# Patient Record
Sex: Female | Born: 1990 | Race: White | Hispanic: No | Marital: Married | Smoking: Never smoker
Health system: Southern US, Community
[De-identification: ages and names within clinical notes are randomized; demographics above are authoritative.]

## PROBLEM LIST (undated history)

## (undated) DIAGNOSIS — F419 Anxiety disorder, unspecified: Secondary | ICD-10-CM

## (undated) DIAGNOSIS — E669 Obesity, unspecified: Secondary | ICD-10-CM

## (undated) DIAGNOSIS — F32A Depression, unspecified: Secondary | ICD-10-CM

## (undated) DIAGNOSIS — N946 Dysmenorrhea, unspecified: Secondary | ICD-10-CM

## (undated) DIAGNOSIS — Z789 Other specified health status: Secondary | ICD-10-CM

## (undated) DIAGNOSIS — K8681 Exocrine pancreatic insufficiency: Secondary | ICD-10-CM

## (undated) DIAGNOSIS — Z9889 Other specified postprocedural states: Secondary | ICD-10-CM

## (undated) DIAGNOSIS — K219 Gastro-esophageal reflux disease without esophagitis: Secondary | ICD-10-CM

## (undated) HISTORY — DX: Other specified health status: Z78.9

## (undated) HISTORY — PX: COLONOSCOPY: SHX174

## (undated) HISTORY — DX: Depression, unspecified: F32.A

## (undated) HISTORY — PX: OTHER SURGICAL HISTORY: SHX169

## (undated) HISTORY — PX: HAND SURGERY: SHX662

## (undated) HISTORY — DX: Dysmenorrhea, unspecified: N94.6

## (undated) HISTORY — DX: Gastro-esophageal reflux disease without esophagitis: K21.9

## (undated) HISTORY — DX: Obesity, unspecified: E66.9

---
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
## 2017-04-01 DIAGNOSIS — Z30017 Encounter for initial prescription of implantable subdermal contraceptive: Secondary | ICD-10-CM | POA: Insufficient documentation

## 2018-02-06 ENCOUNTER — Encounter: Payer: Self-pay | Admitting: Podiatry

## 2018-02-06 ENCOUNTER — Other Ambulatory Visit: Payer: Self-pay

## 2018-02-06 ENCOUNTER — Ambulatory Visit (INDEPENDENT_AMBULATORY_CARE_PROVIDER_SITE_OTHER): Payer: Managed Care, Other (non HMO) | Admitting: Podiatry

## 2018-02-06 DIAGNOSIS — M79676 Pain in unspecified toe(s): Secondary | ICD-10-CM | POA: Diagnosis not present

## 2018-02-06 DIAGNOSIS — B351 Tinea unguium: Secondary | ICD-10-CM

## 2018-02-06 DIAGNOSIS — L600 Ingrowing nail: Secondary | ICD-10-CM

## 2018-02-06 MED ORDER — KETOCONAZOLE 2 % EX CREA: TOPICAL_CREAM | CUTANEOUS | 0 refills | Status: DC

## 2018-02-06 NOTE — Patient Instructions (Signed)

## 2018-02-06 NOTE — Progress Notes (Signed)
  Subjective:  Patient ID: Wanda Acosta, female    DOB: Aug 02, 1990,  MRN: [id number]  No chief complaint on file.  27 y.o. female presents with the above complaint. Reports ingrown toenail to the left great toenail. Reports chronic change to the toenail from a pedicure. Tried lamisil to resolve the fungus but it didn't work. Believes the fungus has spread to her skin.  No past medical history on file.  Current Outpatient Medications:  .  fluticasone (FLONASE) 50 MCG/ACT nasal spray, Place into the nose., Disp: , Rfl:  .  HYDROcodone-acetaminophen (NORCO) 10-325 MG tablet, Take by mouth., Disp: , Rfl:  .  hydrOXYzine (ATARAX/VISTARIL) 10 MG tablet, Take by mouth., Disp: , Rfl:  .  ISIBLOOM 0.15-30 MG-MCG tablet, TK 1 T PO QD, Disp: , Rfl: 3 .  LORazepam (ATIVAN) 1 MG tablet, TK 1 T PO THE EVENING BEFORE APPOINTMENT THEN 1 HOUR PRIOR TO APPOINTMENT, Disp: , Rfl: 0  Allergies not on file Review of Systems: Negative except as noted in the HPI. Denies N/V/F/Ch. Objective:  There were no vitals filed for this visit. General AA&O x3. Normal mood and affect.  Vascular Dorsalis pedis and posterior tibial pulses  present 2+ bilaterally  Capillary refill normal to all digits. Pedal hair growth normal.  Neurologic Epicritic sensation grossly present.  Dermatologic No open lesions. Interspaces clear of maceration. Ingrown toenail left toenail medial border. Nail dystrophic. Xerosis with scaling plantar foot left.  Orthopedic: MMT 5/5 in dorsiflexion, plantarflexion, inversion, and eversion. Normal joint ROM without pain or crepitus.   Assessment & Plan:  Patient was evaluated and treated and all questions answered.  Ingrown Nail, left -Patient elects to proceed with ingrown toenail removal today -Ingrown nail excised. See procedure note. -Educated on post-procedure care including soaking. Written instructions provided. -Patient to follow up in 2 weeks for nail check.  Procedure: Avulsion of  Toenail Location: Left 1st toenail. Anesthesia: Lidocaine 1% plain; 1.29mL and Marcaine 0.5% plain; 1.17mL, digital block. Skin Prep: Betadine. Dressing: Silvadene; telfa; dry, sterile, compression dressing. Technique: Following skin prep, the toe was exsanguinated and a tourniquet was secured at the base of the toe. The affected nail was freed and avulsed. The area was cleansed. The tourniquet was then removed and sterile dressing applied. Disposition: Patient tolerated procedure well. Patient to return in 2 weeks for follow-up.   Tinea Pedis L -Rx Ketoconazole.  No follow-ups on file.

## 2018-02-09 ENCOUNTER — Telehealth: Payer: Self-pay | Admitting: Podiatry

## 2018-02-09 NOTE — Telephone Encounter (Signed)
Left message for pt to call again for information concerning the toenail and the procedure.

## 2018-02-09 NOTE — Telephone Encounter (Signed)
I was in your office on Saturday and had my toenail removed. I just have some questions about the way its healing and some pain that I've been experiencing. If you could please give me a call back at [phone]. Thank you.

## 2018-02-10 NOTE — Telephone Encounter (Signed)
Pt states she started her post-procedure soaking, the 1st soak was not too bad, but the 2nd was a 10 out of 10 on the pain scale. I told pt the 1st soak she probably still had the benefit of the anesthetic, and the 2nd none, and it would also depend on the amount of area removed. Pt states the whole toenail was removed. I told pt she would probably end up soaking the toe for 2-4 weeks until the area got a dry hard scab, without redness, swelling or drainage, and to check about the end of the 3rd week by performing the last soak of the day and allowing to air dry without the antibiotic ointment dressing. I told pt if she should at anytime have increase redness, swelling or cloudy drainage to call for an appt. Pt states understanding.

## 2018-05-17 ENCOUNTER — Ambulatory Visit (INDEPENDENT_AMBULATORY_CARE_PROVIDER_SITE_OTHER): Payer: Managed Care, Other (non HMO) | Admitting: Family Medicine

## 2018-05-17 ENCOUNTER — Encounter: Payer: Self-pay | Admitting: Family Medicine

## 2018-05-17 VITALS — BP 114/72 | HR 104 | Temp 98.5°F | Ht 68.0 in | Wt 213.1 lb

## 2018-05-17 DIAGNOSIS — Z7689 Persons encountering health services in other specified circumstances: Secondary | ICD-10-CM

## 2018-05-17 DIAGNOSIS — Z9109 Other allergy status, other than to drugs and biological substances: Secondary | ICD-10-CM

## 2018-05-17 DIAGNOSIS — K219 Gastro-esophageal reflux disease without esophagitis: Secondary | ICD-10-CM

## 2018-05-17 MED ORDER — MONTELUKAST SODIUM 10 MG PO TABS: 10.0000 mg | ORAL_TABLET | Freq: Every day | ORAL | 3 refills | Status: DC

## 2018-05-17 MED ORDER — LEVOCETIRIZINE DIHYDROCHLORIDE 5 MG PO TABS: 5.0000 mg | ORAL_TABLET | Freq: Every evening | ORAL | 2 refills | Status: DC

## 2018-05-17 NOTE — Patient Instructions (Signed)
Start with the [place] after checking with your OB/GYN about safety in pregnancy.  Also check about the Singulair with your OB/GYN about safety.  Let us know if you need anything.

## 2018-05-17 NOTE — Progress Notes (Signed)
Chief Complaint  Patient presents with  . New Patient (Initial Visit)       New Patient Visit SUBJECTIVE: HPI: Wanda Acosta is an 27 y.o.female who is being seen for establishing care.  The patient was previously seen at her GYN.  Hx of allergies.  She has been on Zyrtec, Allegra, Claritin, Flonase, and Nasonex without relief.  She is currently taking the generic version of Rhinocort.  She has congestion, drainage, and itchy eyes year-round since moving to [place].  She has never been on Singulair, nor has she seen an allergist.  Patient also has a history of reflux well-controlled on cimetidine.  3 years ago tetanus shot.  No Known Allergies  Past Medical History:  Diagnosis Date  . No known health problems    Past Surgical History:  Procedure Laterality Date  . OTHER SURGICAL HISTORY     Finger reattachment   Family History  Problem Relation Age of Onset  . Diabetes Maternal Grandfather   . Cancer Paternal Grandfather        colon cancer   No Known Allergies   ROS Cardiovascular: Denies chest pain  Respiratory: Denies dyspnea   OBJECTIVE: BP 114/72 (BP Location: Left Arm, Patient Position: Sitting, Cuff Size: Large)   Pulse (!) 104   Temp 98.5 F (36.9 C) (Oral)   Ht 5\' 8"  (1.727 m)   Wt 213 lb 2 oz (96.7 kg)   SpO2 97%   BMI 32.41 kg/m   Constitutional: -  VS reviewed -  Well developed, well nourished, appears stated age -  No apparent distress  Psychiatric: -  Oriented to person, place, and time -  Memory intact -  Affect and mood normal -  Fluent conversation, good eye contact -  Judgment and insight age appropriate  Eye: -  Conjunctivae clear, no discharge -  Pupils symmetric, round, reactive to light  ENMT: -  MMM    Pharynx moist, no exudate, no erythema  Neck: -  No gross swelling, no palpable masses -  Thyroid midline, not enlarged, mobile, no palpable masses  Cardiovascular: -  RRR (heart rate around 84 on my exam) -  No LE edema   Respiratory: -  Normal respiratory effort, no accessory muscle use, no retraction -  Breath sounds equal, no wheezes, no ronchi, no crackles  Musculoskeletal: -  No clubbing, no cyanosis -  Gait normal  Skin: -  No significant lesion on inspection -  Warm and dry to palpation   ASSESSMENT/PLAN: Encounter to establish care  Environmental allergies - Plan: levocetirizine (XYZAL) 5 MG tablet, montelukast (SINGULAIR) 10 MG tablet  Gastroesophageal reflux disease, esophagitis presence not specified - Plan: cimetidine (TAGAMET) 200 MG tablet  Patient instructed to sign release of records form from her previous PCP. Try Xyzal.  If no improvement over the next couple weeks, will add Singulair.  She was instructed to run these medications by her obstetrician she is trying to become pregnant through IVF.  We will also consider referring to allergy team for further evaluation and management. Continue cimetidine. Patient should return in 1 year for physical or as needed. The patient voiced understanding and agreement to the plan.   [place], DO 05/17/18  8:33 AM

## 2018-05-17 NOTE — Progress Notes (Signed)
Pre visit review using our clinic review tool, if applicable. No additional management support is needed unless otherwise documented below in the visit note. 

## 2018-05-25 ENCOUNTER — Ambulatory Visit: Payer: Self-pay

## 2018-05-25 NOTE — Telephone Encounter (Signed)
Incoming call from Patient with complaint of waking up with eye pressure and eyes swollen shut.  Patient states she couldn't keep them open.  Patient states the pain is constant.  Rates it moderate. (5)  States the symptoms exist in both eyes.  Patient doesn't know what may be causing this.  When asked if she had blurred vision.  Patient reply is " sort of".  Denies any drainage.  Denies fever and denies any other symptoms. Reviewed care advice with Patient.  Voiced understanding.  Provided appointment for Wednesday 05/26/18 at 3: 00pm.  With Dr.  Mackie Pai.  Advised Patient to arrive @ 2:45pm to register.  Patient voiced understanding.   Reason for Disposition . [1] Mild eyelid  pain is a recurrent problem AND [2] red and crusty eyelids  Answer Assessment - Initial Assessment Questions 1. ONSET: "When did the pain start?" (e.g., minutes, hours, days)     This morning  2. TIMING: "Does the pain come and go, or has it been constant since it started?" (e.g., constant, intermittent, fleeting)     constant 3. SEVERITY: "How bad is the pain?"   (Scale 1-10; mild, moderate or severe)   - MILD (1-3): doesn't interfere with normal activities    - MODERATE (4-7): interferes with normal activities or awakens from sleep    - SEVERE (8-10): excruciating pain and patient unable to do normal activities     moderate 4. LOCATION: "Where does it hurt?"  (e.g., eyelid, eye, cheekbone)     *No Answer*bilateral 5. CAUSE: "What do you think is causing the pain?"     no 6. VISION: "Do you have blurred vision or changes in your vision?"      "Sort of" 7. EYE DISCHARGE: "Is there any discharge (pus) from the eye(s)?"  If yes, ask: "What color is it?"      *No Answer*no drainage8. FEVER: "Do you have a fever?" If so, ask: "What is it, how was it measured, and when did it start?"      *No Answer* 9. OTHER SYMPTOMS: "Do you have any other symptoms?" (e.g., headache, nasal discharge, facial rash)     No  10.  PREGNANCY: "Is there any chance you are pregnant?" "When was your last menstrual period?"       *No Answer*no  Protocols used: EYE PAIN-A-AH

## 2018-05-26 ENCOUNTER — Encounter: Payer: Self-pay | Admitting: Medical

## 2018-05-26 ENCOUNTER — Ambulatory Visit (INDEPENDENT_AMBULATORY_CARE_PROVIDER_SITE_OTHER): Payer: Managed Care, Other (non HMO) | Admitting: Medical

## 2018-05-26 VITALS — BP 125/77 | HR 79 | Temp 98.4°F | Resp 16 | Ht 68.0 in | Wt 218.0 lb

## 2018-05-26 DIAGNOSIS — J309 Allergic rhinitis, unspecified: Secondary | ICD-10-CM | POA: Diagnosis not present

## 2018-05-26 DIAGNOSIS — H00016 Hordeolum externum left eye, unspecified eyelid: Secondary | ICD-10-CM | POA: Diagnosis not present

## 2018-05-26 MED ORDER — TOBRAMYCIN 0.3 % OP SOLN: 2.0000 [drp] | Freq: Four times a day (QID) | OPHTHALMIC | 0 refills | Status: DC

## 2018-05-26 NOTE — Patient Instructions (Signed)
You do appear to have early left eye stye by exam today.  Right upper eyelid is recently tender but no stye palpated on exam.  I did prescribe Tobrex and recommend that you go ahead and use it for both eyes.  Also recommend apply warm compresses every 8 hours daily.  If any vision changes occur, eye pressure, or new symptoms please let us know.  If these changes were to occur before tomorrow afternoon and you notify me then I could possibly get you in with optometrist early Friday morning.  For history of allergic rhinitis, recommend you take Xyzal and montelukast as instructed by PCP.  Follow-up in 7 days or as needed.

## 2018-05-26 NOTE — Progress Notes (Signed)
Subjective:    Patient ID: Wanda Acosta, female    DOB: 1991/03/04, 27 y.o.   MRN: [id number]  HPI  Pt in today states she just woke up yesterday with moderate to severe swelling to both upper and lower eye lids. No discharge to either eye. Her eyes were not itching. She states lids feel little achy. No vision change. She wears glasses. Eyes feel the same. Pt does have some nasal congestion but normal level per her report. No obvious recent flare of allergies. Pt was prescribed xyzal and montelukast. She is still on flonase but about to run out. She never got xyzal or montelukast filled.  Pt states eye are no longer puffy.  Pt Monday night eyes felt normal. Monday stayed in doors. No exposure to outdoors, chemical at work or dust.  Lmp- 2 weeks ago.     Review of Systems  Constitutional: Negative for chills, fatigue and fever.  Eyes: Negative for photophobia, discharge, itching and visual disturbance.       See hpi.  Respiratory: Negative for cough, chest tightness, shortness of breath and wheezing.   Cardiovascular: Negative for chest pain and palpitations.  Gastrointestinal: Negative for abdominal distention and abdominal pain.  Neurological: Negative for dizziness and headaches.  Hematological: Negative for adenopathy. Does not bruise/bleed easily.  Psychiatric/Behavioral: Negative for behavioral problems and confusion.    Past Medical History:  Diagnosis Date  . No known health problems      Social History   Socioeconomic History  . Marital status: Married    Spouse name: Not on file  . Number of children: Not on file  . Years of education: Not on file  . Highest education level: Not on file  Occupational History  . Not on file  Social Needs  . Financial resource strain: Not on file  . Food insecurity:    Worry: Not on file    Inability: Not on file  . Transportation needs:    Medical: Not on file    Non-medical: Not on file  Tobacco Use  . Smoking status:  Never Smoker  . Smokeless tobacco: Never Used  Substance and Sexual Activity  . Alcohol use: Never    Frequency: Never  . Drug use: Never  . Sexual activity: Yes    Partners: Male  Lifestyle  . Physical activity:    Days per week: Not on file    Minutes per session: Not on file  . Stress: Not on file  Relationships  . Social connections:    Talks on phone: Not on file    Gets together: Not on file    Attends religious service: Not on file    Active member of club or organization: Not on file    Attends meetings of clubs or organizations: Not on file    Relationship status: Not on file  . Intimate partner violence:    Fear of current or ex partner: Not on file    Emotionally abused: Not on file    Physically abused: Not on file    Forced sexual activity: Not on file  Other Topics Concern  . Not on file  Social History Narrative  . Not on file    Past Surgical History:  Procedure Laterality Date  . OTHER SURGICAL HISTORY     Finger reattachment    Family History  Problem Relation Age of Onset  . Diabetes Maternal Grandfather   . Cancer Paternal Grandfather  colon cancer    No Known Allergies  Current Outpatient Medications on File Prior to Visit  Medication Sig Dispense Refill  . aspirin 81 MG chewable tablet Chew 81 mg by mouth daily.    . cimetidine (TAGAMET) 200 MG tablet Take 200 mg by mouth daily.    Marland Kitchen estradiol valerate (DELESTROGEN) 20 MG/ML injection 0.3 cc twice a week    . levocetirizine (XYZAL) 5 MG tablet Take 1 tablet (5 mg total) by mouth every evening. 30 tablet 2  . montelukast (SINGULAIR) 10 MG tablet Take 1 tablet (10 mg total) by mouth at bedtime. 30 tablet 3  . progesterone (PROMETRIUM) 100 MG capsule Take 100 mg by mouth 2 (two) times daily.     No current facility-administered medications on file prior to visit.     BP 125/77   Pulse 79   Temp 98.4 F (36.9 C) (Oral)   Resp 16   Ht 5\' 8"  (1.727 m)   Wt 218 lb (98.9 kg)   SpO2  100%   BMI 33.15 kg/m       Objective:   Physical Exam  General  Mental Status - Alert. General Appearance - Well groomed. Not in acute distress.  Skin Rashes- No Rashes.  HEENT Head- Normal.  Normal. Eye Sclera/Conjunctiva- Left- Normal. Right- Normal.  Both eyelids do not appear swollen on inspection.  No obvious redness.  Right upper eyelid faintly tender to palpation.  Left upper eyelid- mid aspect and eyelash area has small palpable stye.  Tenderness in that region. Nose & Sinuses Nasal Mucosa- Left-not boggy and Congested. Right-not boggy and  Congested.Bilateral no maxillary and no frontal sinus pressure.   Neck Neck- Supple. No Masses.   Chest and Lung Exam Auscultation: Breath Sounds:-Clear even and unlabored.  Cardiovascular Auscultation:Rythm- Regular, rate and rhythm. Murmurs & Other Heart Sounds:Ausculatation of the heart reveal- No Murmurs.  Lymphatic Head & Neck General Head & Neck Lymphatics: Bilateral: Description- No Localized lymphadenopathy.       Assessment & Plan:  You do appear to have early left eye stye by exam today.  Right upper eyelid is recently tender but no stye palpated on exam.  I did prescribe Tobrex and recommend that you go ahead and use it for both eyes.  Also recommend apply warm compresses every 8 hours daily.  If any vision changes occur, eye pressure, or new symptoms please let us know.  If these changes were to occur before tomorrow afternoon and you notify me then I could possibly get you in with optometrist early Friday morning.  For history of allergic rhinitis, recommend you take Xyzal and montelukast as instructed by PCP.  Follow-up in 7 days or as needed.  Mackie Pai, PA-C

## 2018-06-30 ENCOUNTER — Encounter: Payer: Self-pay | Admitting: Podiatry

## 2018-06-30 ENCOUNTER — Ambulatory Visit (INDEPENDENT_AMBULATORY_CARE_PROVIDER_SITE_OTHER): Payer: Managed Care, Other (non HMO) | Admitting: Podiatry

## 2018-06-30 DIAGNOSIS — L600 Ingrowing nail: Secondary | ICD-10-CM

## 2018-06-30 MED ORDER — NEOMYCIN-POLYMYXIN-HC 3.5-10000-1 OT SOLN: OTIC | 1 refills | Status: DC

## 2018-06-30 NOTE — Patient Instructions (Signed)

## 2018-07-01 ENCOUNTER — Other Ambulatory Visit: Payer: Self-pay | Admitting: Podiatry

## 2018-07-01 ENCOUNTER — Telehealth: Payer: Self-pay | Admitting: Podiatry

## 2018-07-01 MED ORDER — TRAMADOL HCL 50 MG PO TABS: 50.0000 mg | ORAL_TABLET | ORAL | 0 refills | Status: DC | PRN

## 2018-07-01 NOTE — Telephone Encounter (Signed)
Left message informing pt that she should go ahead begin the soaks and dressing change, that if she could take OTC ibuprofen take as the package instructs.

## 2018-07-01 NOTE — Progress Notes (Signed)
Still having extreme pain post procedure. Rx for small amount of tramadol for pain. Advised to ice and elecate

## 2018-07-01 NOTE — Telephone Encounter (Signed)
Pt had ingrown toenail removed yesterday and is still in severe pain. Pt has taken Tylenol and is getting no relief. Please give pt a call.

## 2018-07-01 NOTE — Progress Notes (Signed)
Subjective:   Patient ID: Wanda Acosta, female   DOB: 27 y.o.   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  We are going to try the border but she understands both of the medial nail may need to be removed and at this point we will try to avoid that.  I infiltrated the left hallux 60 mg like Marcaine mixture sterile prep applied and using sterile instrumentation remove the lateral border exposed matrix applied phenol 3 applications 30 seconds followed by alcohol lavage sterile dressing gave instructions on soaks.  Placed on Cortisporin otic solution and instructed what to do if throbbing were to occur encouraged to take the dressing off earlier and encouraged her to call with any questions concerns she may have

## 2018-07-05 ENCOUNTER — Telehealth: Payer: Self-pay | Admitting: Podiatry

## 2018-07-05 ENCOUNTER — Ambulatory Visit (INDEPENDENT_AMBULATORY_CARE_PROVIDER_SITE_OTHER): Payer: Self-pay | Admitting: Podiatry

## 2018-07-05 DIAGNOSIS — L600 Ingrowing nail: Secondary | ICD-10-CM

## 2018-07-05 NOTE — Telephone Encounter (Signed)
Pt states the toe has no feeling, feels like the toe is being squeezed. I asked pt if the blister fluid was cloudy and how the toe looked, and if press the toe returned to color quickly. Pt states the toe is painful, and there is white skin at the bottom of the toe. I asked pt if it looked like white skin from the blister and she stated yes, but it was so painful she was having difficulty walking. Offered an appt today at 4:15pm with Gretta Arab, RN.

## 2018-07-05 NOTE — Telephone Encounter (Signed)
Pt had ingrown toenail removed last week, blister formed on bottom of foot and popped today (Yellow fluid). Pt is concerned if she needs to be seen. Please give pt a call.

## 2018-07-08 NOTE — Progress Notes (Signed)
Patient is here today with concern about numbness on her big toe.  Recent procedure performed on 06/30/2018 removal of ingrown nail borders left hallux.  She states that her toe is somewhat painful and sore and that the side of the toe is numb.  Noted some mild redness at the base of the nail, with blistering on the bottom of the toe.  This appears to be a phenol reaction.  There is no erythema, no drainage, and the removed nail borders beginning to scab over.  Patient was evaluated by Dr. Paulla Dolly.  Discussed signs and symptoms of infection with the patient, verbal and written instructions were given, she is to follow-up as needed if symptoms worsen or fail to improve.

## 2018-07-13 ENCOUNTER — Encounter: Payer: Self-pay | Admitting: Family Medicine

## 2018-07-13 ENCOUNTER — Ambulatory Visit (INDEPENDENT_AMBULATORY_CARE_PROVIDER_SITE_OTHER): Payer: Managed Care, Other (non HMO) | Admitting: Family Medicine

## 2018-07-13 VITALS — BP 148/80 | HR 121 | Temp 98.5°F | Ht 68.0 in | Wt 221.2 lb

## 2018-07-13 DIAGNOSIS — M545 Low back pain, unspecified: Secondary | ICD-10-CM

## 2018-07-13 MED ORDER — KETOROLAC TROMETHAMINE 60 MG/2ML IM SOLN
60.0000 mg | Freq: Once | INTRAMUSCULAR | Status: AC
  Administered 2018-07-13: 60 mg via INTRAMUSCULAR

## 2018-07-13 MED ORDER — ACETAMINOPHEN-CODEINE 300-30 MG PO TABS: 1.0000 | ORAL_TABLET | Freq: Four times a day (QID) | ORAL | 0 refills | Status: DC | PRN

## 2018-07-13 MED ORDER — MELOXICAM 15 MG PO TABS: 15.0000 mg | ORAL_TABLET | Freq: Every day | ORAL | 0 refills | Status: DC

## 2018-07-13 NOTE — Progress Notes (Signed)
Musculoskeletal Exam  Patient: Wanda Acosta DOB: [birth year]  DOS: 07/13/2018  SUBJECTIVE:  Chief Complaint:   Chief Complaint  Patient presents with  . Back Pain    Wanda Acosta is a 27 y.o.  female for evaluation and treatment of his back pain.   Onset:  1 day ago.  No inj or change and activity. Had tailbone inj around 3 yrs ago.  Location: lower back, b/l Character:  aching and sharp  Progression of issue:  has worsened Associated symptoms: some tingling Denies bowel/bladder incontinence or weakness Treatment: to date has been OTC NSAIDS.   Neurovascular symptoms: no  ROS: Musculoskeletal/Extremities: +back pain Neurologic: no numbness, tingling no weakness   Past Medical History:  Diagnosis Date  . No known health problems     Objective:  VITAL SIGNS: BP (!) 148/80 (BP Location: Left Arm, Patient Position: Sitting, Cuff Size: Normal)   Pulse (!) 121   Temp 98.5 F (36.9 C) (Oral)   Ht 5\' 8"  (1.727 m)   Wt 221 lb 4 oz (100.4 kg)   SpO2 98%   BMI 33.64 kg/m  Constitutional: Well formed, well developed. No acute distress. HENT: Normocephalic, atraumatic.  Thorax & Lungs:  No accessory muscle use Extremities: No clubbing. No cyanosis. No edema.  Skin: Warm. Dry. No erythema. No rash.  Musculoskeletal: low back.   Tenderness to palpation: some midline pain Deformity: no Ecchymosis: no Straight leg test: negative for Poor hamstring flexibility b/l. Neurologic: Normal sensory function. No focal deficits noted. DTR's equal and symmetry in LE's. No clonus. Psychiatric: Normal mood. Age appropriate judgment and insight. Alert & oriented x 3.    Assessment:  Acute midline low back pain without sciatica - Plan: meloxicam (MOBIC) 15 MG tablet, Acetaminophen-Codeine 300-30 MG tablet  Plan: Orders as above. Stretches/exercises, heat, ice, Tylenol 3 for breakthrough, NSAIDs. BP 2/2 pain.  F/u prn. The patient voiced understanding and agreement to the  plan.   [place], DO 07/13/18  3:25 PM

## 2018-07-13 NOTE — Progress Notes (Signed)
Pre visit review using our clinic review tool, if applicable. No additional management support is needed unless otherwise documented below in the visit note. 

## 2018-07-13 NOTE — Addendum Note (Signed)
Addended by: Sharon Seller B on: 07/13/2018 03:33 PM   Modules accepted: Orders

## 2018-07-13 NOTE — Patient Instructions (Addendum)
No anti-inflammatories for the rest of the day.   Heat (pad or rice pillow in microwave) over affected area, 10-15 minutes twice daily.   Ice/cold pack over area for 10-15 min twice daily.  Do not drink alcohol, do any illicit/street drugs, drive or do anything that requires alertness while on this medicine (Tylenol 3).  Start the meloxicam tomorrow (Wednesday).  If any stretch/exercise is too uncomfortable or painful, do not do it.   Let us know if you need anything.  EXERCISES  RANGE OF MOTION (ROM) AND STRETCHING EXERCISES - Low Back Pain Most people with lower back pain will find that their symptoms get worse with excessive bending forward (flexion) or arching at the lower back (extension). The exercises that will help resolve your symptoms will focus on the opposite motion.  If you have pain, numbness or tingling which travels down into your buttocks, leg or foot, the goal of the therapy is for these symptoms to move closer to your back and eventually resolve. Sometimes, these leg symptoms will get better, but your lower back pain may worsen. This is often an indication of progress in your rehabilitation. Be very alert to any changes in your symptoms and the activities in which you participated in the 24 hours prior to the change. Sharing this information with your caregiver will allow him or her to most efficiently treat your condition. These exercises may help you when beginning to rehabilitate your injury. Your symptoms may resolve with or without further involvement from your physician, physical therapist or athletic trainer. While completing these exercises, remember:   Restoring tissue flexibility helps normal motion to return to the joints. This allows healthier, less painful movement and activity.  An effective stretch should be held for at least 30 seconds.  A stretch should never be painful. You should only feel a gentle lengthening or release in the stretched tissue. FLEXION  RANGE OF MOTION AND STRETCHING EXERCISES:  STRETCH - Flexion, Single Knee to Chest   Lie on a firm bed or floor with both legs extended in front of you.  Keeping one leg in contact with the floor, bring your opposite knee to your chest. Hold your leg in place by either grabbing behind your thigh or at your knee.  Pull until you feel a gentle stretch in your low back. Hold 30 seconds.  Slowly release your grasp and repeat the exercise with the opposite side. Repeat 2 times. Complete this exercise 3 times per week.   STRETCH - Flexion, Double Knee to Chest  Lie on a firm bed or floor with both legs extended in front of you.  Keeping one leg in contact with the floor, bring your opposite knee to your chest.  Tense your stomach muscles to support your back and then lift your other knee to your chest. Hold your legs in place by either grabbing behind your thighs or at your knees.  Pull both knees toward your chest until you feel a gentle stretch in your low back. Hold 30 seconds.  Tense your stomach muscles and slowly return one leg at a time to the floor. Repeat 2 times. Complete this exercise 3 times per week.   STRETCH - Low Trunk Rotation  Lie on a firm bed or floor. Keeping your legs in front of you, bend your knees so they are both pointed toward the ceiling and your feet are flat on the floor.  Extend your arms out to the side. This will stabilize your upper  body by keeping your shoulders in contact with the floor.  Gently and slowly drop both knees together to one side until you feel a gentle stretch in your low back. Hold for 30 seconds.  Tense your stomach muscles to support your lower back as you bring your knees back to the starting position. Repeat the exercise to the other side. Repeat 2 times. Complete this exercise at least 3 times per week.   EXTENSION RANGE OF MOTION AND FLEXIBILITY EXERCISES:  STRETCH - Extension, Prone on Elbows   Lie on your stomach on the  floor, a bed will be too soft. Place your palms about shoulder width apart and at the height of your head.  Place your elbows under your shoulders. If this is too painful, stack pillows under your chest.  Allow your body to relax so that your hips drop lower and make contact more completely with the floor.  Hold this position for 30 seconds.  Slowly return to lying flat on the floor. Repeat 2 times. Complete this exercise 3 times per week.   RANGE OF MOTION - Extension, Prone Press Ups  Lie on your stomach on the floor, a bed will be too soft. Place your palms about shoulder width apart and at the height of your head.  Keeping your back as relaxed as possible, slowly straighten your elbows while keeping your hips on the floor. You may adjust the placement of your hands to maximize your comfort. As you gain motion, your hands will come more underneath your shoulders.  Hold this position 30 seconds.  Slowly return to lying flat on the floor. Repeat 2 times. Complete this exercise 3 times per week.   RANGE OF MOTION- Quadruped, Neutral Spine   Assume a hands and knees position on a firm surface. Keep your hands under your shoulders and your knees under your hips. You may place padding under your knees for comfort.  Drop your head and point your tailbone toward the ground below you. This will round out your lower back like an angry cat. Hold this position for 30 seconds.  Slowly lift your head and release your tail bone so that your back sags into a large arch, like an old horse.  Hold this position for 30 seconds.  Repeat this until you feel limber in your low back.  Now, find your "sweet spot." This will be the most comfortable position somewhere between the two previous positions. This is your neutral spine. Once you have found this position, tense your stomach muscles to support your low back.  Hold this position for 30 seconds. Repeat 2 times. Complete this exercise 3 times per  week.   STRENGTHENING EXERCISES - Low Back Sprain These exercises may help you when beginning to rehabilitate your injury. These exercises should be done near your "sweet spot." This is the neutral, low-back arch, somewhere between fully rounded and fully arched, that is your least painful position. When performed in this safe range of motion, these exercises can be used for people who have either a flexion or extension based injury. These exercises may resolve your symptoms with or without further involvement from your physician, physical therapist or athletic trainer. While completing these exercises, remember:   Muscles can gain both the endurance and the strength needed for everyday activities through controlled exercises.  Complete these exercises as instructed by your physician, physical therapist or athletic trainer. Increase the resistance and repetitions only as guided.  You may experience muscle soreness or  fatigue, but the pain or discomfort you are trying to eliminate should never worsen during these exercises. If this pain does worsen, stop and make certain you are following the directions exactly. If the pain is still present after adjustments, discontinue the exercise until you can discuss the trouble with your caregiver.  STRENGTHENING - Deep Abdominals, Pelvic Tilt   Lie on a firm bed or floor. Keeping your legs in front of you, bend your knees so they are both pointed toward the ceiling and your feet are flat on the floor.  Tense your lower abdominal muscles to press your low back into the floor. This motion will rotate your pelvis so that your tail bone is scooping upwards rather than pointing at your feet or into the floor. With a gentle tension and even breathing, hold this position for 3 seconds. Repeat 2 times. Complete this exercise 3 times per week.   STRENGTHENING - Abdominals, Crunches   Lie on a firm bed or floor. Keeping your legs in front of you, bend your knees so  they are both pointed toward the ceiling and your feet are flat on the floor. Cross your arms over your chest.  Slightly tip your chin down without bending your neck.  Tense your abdominals and slowly lift your trunk high enough to just clear your shoulder blades. Lifting higher can put excessive stress on the lower back and does not further strengthen your abdominal muscles.  Control your return to the starting position. Repeat 2 times. Complete this exercise 3 times per week.   STRENGTHENING - Quadruped, Opposite UE/LE Lift   Assume a hands and knees position on a firm surface. Keep your hands under your shoulders and your knees under your hips. You may place padding under your knees for comfort.  Find your neutral spine and gently tense your abdominal muscles so that you can maintain this position. Your shoulders and hips should form a rectangle that is parallel with the floor and is not twisted.  Keeping your trunk steady, lift your right hand no higher than your shoulder and then your left leg no higher than your hip. Make sure you are not holding your breath. Hold this position for 30 seconds.  Continuing to keep your abdominal muscles tense and your back steady, slowly return to your starting position. Repeat with the opposite arm and leg. Repeat 2 times. Complete this exercise 3 times per week.   STRENGTHENING - Abdominals and Quadriceps, Straight Leg Raise   Lie on a firm bed or floor with both legs extended in front of you.  Keeping one leg in contact with the floor, bend the other knee so that your foot can rest flat on the floor.  Find your neutral spine, and tense your abdominal muscles to maintain your spinal position throughout the exercise.  Slowly lift your straight leg off the floor about 6 inches for a count of 3, making sure to not hold your breath.  Still keeping your neutral spine, slowly lower your leg all the way to the floor. Repeat this exercise with each leg  2 times. Complete this exercise 3 times per week.  POSTURE AND BODY MECHANICS CONSIDERATIONS - Low Back Sprain Keeping correct posture when sitting, standing or completing your activities will reduce the stress put on different body tissues, allowing injured tissues a chance to heal and limiting painful experiences. The following are general guidelines for improved posture.  While reading these guidelines, remember:  The exercises prescribed by your  your provider will help you have the flexibility and strength to maintain correct postures.  The correct posture provides the best environment for your joints to work. All of your joints have less wear and tear when properly supported by a spine with good posture. This means you will experience a healthier, less painful body.  Correct posture must be practiced with all of your activities, especially prolonged sitting and standing. Correct posture is as important when doing repetitive low-stress activities (typing) as it is when doing a single heavy-load activity (lifting).  RESTING POSITIONS Consider which positions are most painful for you when choosing a resting position. If you have pain with flexion-based activities (sitting, bending, stooping, squatting), choose a position that allows you to rest in a less flexed posture. You would want to avoid curling into a fetal position on your side. If your pain worsens with extension-based activities (prolonged standing, working overhead), avoid resting in an extended position such as sleeping on your stomach. Most people will find more comfort when they rest with their spine in a more neutral position, neither too rounded nor too arched. Lying on a non-sagging bed on your side with a pillow between your knees, or on your back with a pillow under your knees will often provide some relief. Keep in mind, being in any one position for a prolonged period of time, no matter how correct your posture, can still lead to  stiffness.  PROPER SITTING POSTURE In order to minimize stress and discomfort on your spine, you must sit with correct posture. Sitting with good posture should be effortless for a healthy body. Returning to good posture is a gradual process. Many people can work toward this most comfortably by using various supports until they have the flexibility and strength to maintain this posture on their own. When sitting with proper posture, your ears will fall over your shoulders and your shoulders will fall over your hips. You should use the back of the chair to support your upper back. Your lower back will be in a neutral position, just slightly arched. You may place a small pillow or folded towel at the base of your lower back for  support.  When working at a desk, create an environment that supports good, upright posture. Without extra support, muscles tire, which leads to excessive strain on joints and other tissues. Keep these recommendations in mind:  CHAIR:  A chair should be able to slide under your desk when your back makes contact with the back of the chair. This allows you to work closely.  The chair's height should allow your eyes to be level with the upper part of your monitor and your hands to be slightly lower than your elbows.  BODY POSITION  Your feet should make contact with the floor. If this is not possible, use a foot rest.  Keep your ears over your shoulders. This will reduce stress on your neck and low back.  INCORRECT SITTING POSTURES  If you are feeling tired and unable to assume a healthy sitting posture, do not slouch or slump. This puts excessive strain on your back tissues, causing more damage and pain. Healthier options include:  Using more support, like a lumbar pillow.  Switching tasks to something that requires you to be upright or walking.  Talking a brief walk.  Lying down to rest in a neutral-spine position.  PROLONGED STANDING WHILE SLIGHTLY LEANING  FORWARD  When completing a task that requires you to lean forward while standing   one place for a long time, place either foot up on a stationary 2-4 inch high object to help maintain the best posture. When both feet are on the ground, the lower back tends to lose its slight inward curve. If this curve flattens (or becomes too large), then the back and your other joints will experience too much stress, tire more quickly, and can cause pain.  CORRECT STANDING POSTURES Proper standing posture should be assumed with all daily activities, even if they only take a few moments, like when brushing your teeth. As in sitting, your ears should fall over your shoulders and your shoulders should fall over your hips. You should keep a slight tension in your abdominal muscles to brace your spine. Your tailbone should point down to the ground, not behind your body, resulting in an over-extended swayback posture.   INCORRECT STANDING POSTURES  Common incorrect standing postures include a forward head, locked knees and/or an excessive swayback. WALKING Walk with an upright posture. Your ears, shoulders and hips should all line-up.  PROLONGED ACTIVITY IN A FLEXED POSITION When completing a task that requires you to bend forward at your waist or lean over a low surface, try to find a way to stabilize 3 out of 4 of your limbs. You can place a hand or elbow on your thigh or rest a knee on the surface you are reaching across. This will provide you more stability, so that your muscles do not tire as quickly. By keeping your knees relaxed, or slightly bent, you will also reduce stress across your lower back. CORRECT LIFTING TECHNIQUES  DO :  Assume a wide stance. This will provide you more stability and the opportunity to get as close as possible to the object which you are lifting.  Tense your abdominals to brace your spine. Bend at the knees and hips. Keeping your back locked in a neutral-spine position, lift using  your leg muscles. Lift with your legs, keeping your back straight.  Test the weight of unknown objects before attempting to lift them.  Try to keep your elbows locked down at your sides in order get the best strength from your shoulders when carrying an object.     Always ask for help when lifting heavy or awkward objects. INCORRECT LIFTING TECHNIQUES DO NOT:   Lock your knees when lifting, even if it is a small object.  Bend and twist. Pivot at your feet or move your feet when needing to change directions.  Assume that you can safely pick up even a paperclip without proper posture.

## 2018-07-14 ENCOUNTER — Ambulatory Visit: Payer: Managed Care, Other (non HMO) | Admitting: Podiatry

## 2018-07-28 DIAGNOSIS — K8681 Exocrine pancreatic insufficiency: Secondary | ICD-10-CM

## 2018-07-28 HISTORY — DX: Exocrine pancreatic insufficiency: K86.81

## 2018-09-02 ENCOUNTER — Ambulatory Visit (INDEPENDENT_AMBULATORY_CARE_PROVIDER_SITE_OTHER): Payer: Managed Care, Other (non HMO) | Admitting: Family Medicine

## 2018-09-02 ENCOUNTER — Encounter: Payer: Self-pay | Admitting: Family Medicine

## 2018-09-02 VITALS — BP 118/80 | HR 108 | Temp 98.2°F | Ht 68.0 in | Wt 221.5 lb

## 2018-09-02 DIAGNOSIS — J0100 Acute maxillary sinusitis, unspecified: Secondary | ICD-10-CM

## 2018-09-02 MED ORDER — FLUCONAZOLE 150 MG PO TABS: 150.0000 mg | ORAL_TABLET | Freq: Once | ORAL | 0 refills | Status: AC

## 2018-09-02 MED ORDER — AMOXICILLIN-POT CLAVULANATE 875-125 MG PO TABS: 1.0000 | ORAL_TABLET | Freq: Two times a day (BID) | ORAL | 0 refills | Status: DC

## 2018-09-02 MED FILL — FLUCONAZOLE 150 MG TABS: 150 | 1 days supply | Qty: 1 | Fill #0

## 2018-09-02 MED FILL — AMOX-CLAV 875-125 MG TABLET: 875-125 | 10 days supply | Qty: 20 | Fill #0

## 2018-09-02 NOTE — Progress Notes (Signed)
Chief Complaint  Patient presents with  . Cough    congestion    Wanda Acosta here for URI complaints.  Duration: 2 weeks - getting worse over past 4 days Associated symptoms: Fever (89 F), sinus pain, rhinorrhea, ear pain, sore throat, chest tightness and cough Denies: sinus congestion, itchy watery eyes, ear drainage, wheezing, shortness of breath and myalgia Treatment to date: Dayquil, Mucinex, Nyquil Sick contacts: Yes  ROS:  Const: Denies current fevers HEENT: As noted in HPI Lungs: No SOB  Past Medical History:  Diagnosis Date  . No known health problems     BP 118/80 (BP Location: Left Arm, Patient Position: Sitting, Cuff Size: Large)   Pulse (!) 108   Temp 98.2 F (36.8 C) (Oral)   Ht 5\' 8"  (1.727 m)   Wt 221 lb 8 oz (100.5 kg)   SpO2 99%   BMI 33.68 kg/m  General: Awake, alert, appears stated age HEENT: AT, [place], ears patent b/l and TM's neg, nares patent w/o discharge, max sinuses ttp b/l, pharynx pink and without exudates, MMM Neck: No masses or asymmetry Heart: RRR Lungs: CTAB, no accessory muscle use Psych: Age appropriate judgment and insight, normal mood and affect  Acute maxillary sinusitis, recurrence not specified - Plan: amoxicillin-clavulanate (AUGMENTIN) 875-125 MG tablet, fluconazole (DIFLUCAN) 150 MG tablet  Orders as above. Continue to push fluids, practice good hand hygiene, cover mouth when coughing. Will call in fluconazole for yeast infections. F/u prn. If starting to experience fevers, shaking, or shortness of breath, seek immediate care. Pt voiced understanding and agreement to the plan.  [place], DO 09/02/18 7:40 AM

## 2018-09-02 NOTE — Patient Instructions (Signed)
Continue to push fluids, practice good hand hygiene, and cover your mouth if you cough.  If you start having fevers, shaking or shortness of breath, seek immediate care.  For symptoms, consider using Vick's VapoRub on chest or under nose, air humidifier, Benadryl at night, and elevating the head of the bed. Tylenol and ibuprofen for aches and pains you may be experiencing.   Let us know if you need anything.  

## 2018-11-05 ENCOUNTER — Emergency Department (HOSPITAL_COMMUNITY): Payer: Managed Care, Other (non HMO)

## 2018-11-05 ENCOUNTER — Emergency Department (HOSPITAL_COMMUNITY)
Admission: EM | Admit: 2018-11-05 | Discharge: 2018-11-05 | Disposition: A | Discharge status: Home / Self Care | Payer: Managed Care, Other (non HMO) | Attending: Emergency Medicine | Admitting: Emergency Medicine

## 2018-11-05 ENCOUNTER — Ambulatory Visit: Payer: Self-pay | Admitting: *Deleted

## 2018-11-05 ENCOUNTER — Other Ambulatory Visit: Payer: Self-pay

## 2018-11-05 DIAGNOSIS — R10817 Generalized abdominal tenderness: Secondary | ICD-10-CM | POA: Diagnosis not present

## 2018-11-05 DIAGNOSIS — R1011 Right upper quadrant pain: Secondary | ICD-10-CM | POA: Diagnosis not present

## 2018-11-05 LAB — COMPREHENSIVE METABOLIC PANEL
ALT: 16 U/L (ref 0–44)
AST: 21 U/L (ref 15–41)
Albumin: 4 g/dL (ref 3.5–5.0)
Alkaline Phosphatase: 87 U/L (ref 38–126)
Anion gap: 14 (ref 5–15)
BUN: 9 mg/dL (ref 6–20)
CO2: 24 mmol/L (ref 22–32)
Calcium: 9.8 mg/dL (ref 8.9–10.3)
Chloride: 103 mmol/L (ref 98–111)
Creatinine, Ser: 0.91 mg/dL (ref 0.44–1.00)
GFR calc Af Amer: >60 mL/min (ref >60)
GFR calc non Af Amer: >60 mL/min (ref >60)
Glucose, Bld: 106 mg/dL — ABNORMAL HIGH (ref 70–99)
Potassium: 4.4 mmol/L (ref 3.5–5.1)
Sodium: 141 mmol/L (ref 135–145)
Total Bilirubin: 0.4 mg/dL (ref 0.3–1.2)
Total Protein: 7.5 g/dL (ref 6.5–8.1)

## 2018-11-05 LAB — CBC
HCT: 42.9 % (ref 36.0–46.0)
Hemoglobin: 13.8 g/dL (ref 12.0–15.0)
MCH: 27.2 pg (ref 26.0–34.0)
MCHC: 32.2 g/dL (ref 30.0–36.0)
MCV: 84.6 fL (ref 80.0–100.0)
Platelets: 316 10*3/uL (ref 150–400)
RBC: 5.07 MIL/uL (ref 3.87–5.11)
RDW: 12.4 % (ref 11.5–15.5)
WBC: 8.4 10*3/uL (ref 4.0–10.5)
nRBC: 0 % (ref 0.0–0.2)

## 2018-11-05 LAB — I-STAT BETA HCG BLOOD, ED (MC, WL, AP ONLY): I-stat hCG, quantitative: <5 m[IU]/mL (ref <5)

## 2018-11-05 LAB — LIPASE, BLOOD: Lipase: 26 U/L (ref 11–51)

## 2018-11-05 LAB — URINALYSIS, ROUTINE W REFLEX MICROSCOPIC
Bilirubin Urine: NEGATIVE
Glucose, UA: NEGATIVE mg/dL
Hgb urine dipstick: NEGATIVE
Ketones, ur: NEGATIVE mg/dL
Leukocytes,Ua: NEGATIVE
Nitrite: NEGATIVE
Protein, ur: NEGATIVE mg/dL
Specific Gravity, Urine: 1.023 (ref 1.005–1.030)
pH: 5 (ref 5.0–8.0)

## 2018-11-05 MED ORDER — SODIUM CHLORIDE 0.9% FLUSH
3.0000 mL | Freq: Once | INTRAVENOUS | Status: AC
  Administered 2018-11-05: 3 mL via INTRAVENOUS

## 2018-11-05 MED ORDER — MORPHINE SULFATE (PF) 4 MG/ML IV SOLN
4.0000 mg | Freq: Once | INTRAVENOUS | Status: AC
  Administered 2018-11-05: 4 mg via INTRAVENOUS
  Filled 2018-11-05: qty 1

## 2018-11-05 MED ORDER — IOHEXOL 300 MG/ML  SOLN
100.0000 mL | Freq: Once | INTRAMUSCULAR | Status: AC | PRN
  Administered 2018-11-05: 100 mL via INTRAVENOUS

## 2018-11-05 MED ORDER — ONDANSETRON HCL 4 MG/2ML IJ SOLN
4.0000 mg | Freq: Once | INTRAMUSCULAR | Status: AC
  Administered 2018-11-05: 4 mg via INTRAVENOUS
  Filled 2018-11-05: qty 2

## 2018-11-05 MED ORDER — PANTOPRAZOLE SODIUM 20 MG PO TBEC: 20.0000 mg | DELAYED_RELEASE_TABLET | Freq: Every day | ORAL | 0 refills | Status: DC

## 2018-11-05 MED ORDER — SODIUM CHLORIDE 0.9 % IV BOLUS
1000.0000 mL | Freq: Once | INTRAVENOUS | Status: AC
  Administered 2018-11-05: 1000 mL via INTRAVENOUS

## 2018-11-05 NOTE — ED Notes (Signed)
Pt ambulatory to restroom

## 2018-11-05 NOTE — ED Provider Notes (Signed)
[facility] EMERGENCY DEPARTMENT Provider Note   CSN: [id number] Arrival date & time: 11/05/18  1433    History   Chief Complaint Chief Complaint  Patient presents with  . Abdominal Pain    HPI Wanda Acosta is a 28 y.o. female.     The history is provided by the patient. No language interpreter was used.  Abdominal Pain   Wanda Acosta is a 29 y.o. female who presents to the Emergency Department complaining of abdominal pain. She presents to the ED for evaluation of severe abdominal pain that began yesterday around lunchtime. She reports central severe pain. Overall the pain eased off throughout the day yesterday only to recur a few hours ago. She denies any fevers. She has no vomiting, diarrhea, dysuria. She did have IVF one month ago but did not have any embryos implanted. She denies any medical problems and takes no medications. No tobacco, alcohol, drug use. No prior similar symptoms. Pain is worse with movement, improved with bending over. Past Medical History:  Diagnosis Date  . No known health problems     Patient Active Problem List   Diagnosis Date Noted  . Gastroesophageal reflux disease 05/17/2018  . Environmental allergies 05/17/2018  . Nexplanon insertion 04/01/2017    Past Surgical History:  Procedure Laterality Date  . OTHER SURGICAL HISTORY     Finger reattachment     OB History   No obstetric history on file.      Home Medications    Prior to Admission medications   Medication Sig Start Date End Date Taking? Authorizing Provider  amoxicillin-clavulanate (AUGMENTIN) 875-125 MG tablet Take 1 tablet by mouth 2 (two) times daily. 09/02/18   Shelda Pal, DO    Family History Family History  Problem Relation Age of Onset  . Diabetes Maternal Grandfather   . Cancer Paternal Grandfather        colon cancer    Social History Social History   Tobacco Use  . Smoking status: Never Smoker  . Smokeless tobacco: Never Used   Substance Use Topics  . Alcohol use: Never    Frequency: Never  . Drug use: Never     Allergies   Patient has no known allergies.   Review of Systems Review of Systems  Gastrointestinal: Positive for abdominal pain.  All other systems reviewed and are negative.    Physical Exam Updated Vital Signs BP 131/81 (BP Location: Left Arm)   Pulse (!) 110   Temp 98.5 F (36.9 C) (Oral)   Resp 18   SpO2 99%   Physical Exam Vitals signs and nursing note reviewed.  Constitutional:      Appearance: She is well-developed.  HENT:     Head: Normocephalic and atraumatic.  Cardiovascular:     Rate and Rhythm: Regular rhythm.     Comments: tachycardic Pulmonary:     Effort: Pulmonary effort is normal. No respiratory distress.  Abdominal:     Palpations: Abdomen is soft.     Tenderness: There is no guarding or rebound.     Comments: Moderate generalized abdominal tenderness, greatest over epigastrum, LUQ.   Musculoskeletal:        General: No tenderness.  Skin:    General: Skin is warm and dry.  Neurological:     Mental Status: She is alert and oriented to person, place, and time.  Psychiatric:        Behavior: Behavior normal.      ED Treatments / Results  Labs (all labs ordered are listed, but only abnormal results are displayed) Labs Reviewed  COMPREHENSIVE METABOLIC PANEL - Abnormal; Notable for the following components:      Result Value   Glucose, Bld 106 (*)    All other components within normal limits  LIPASE, BLOOD  CBC  URINALYSIS, ROUTINE W REFLEX MICROSCOPIC  I-STAT BETA HCG BLOOD, ED ([facility], [facility], [facility] ONLY)    EKG EKG Interpretation  Date/Time:  Friday November 05 2018 15:23:47 EDT Ventricular Rate:  112 PR Interval:    QRS Duration: 84 QT Interval:  325 QTC Calculation: 444 R Axis:   66 Text Interpretation:  Sinus tachycardia Low voltage, precordial leads Confirmed by Quintella Reichert [phone]) on 11/05/2018 6:12:37 PM   Radiology Ct Abdomen Pelvis W  Contrast  Result Date: 11/05/2018 CLINICAL DATA:  Severe mid abdominal pain, tachycardia. EXAM: CT ABDOMEN AND PELVIS WITH CONTRAST TECHNIQUE: Multidetector CT imaging of the abdomen and pelvis was performed using the standard protocol following bolus administration of intravenous contrast. CONTRAST:  14mL OMNIPAQUE IOHEXOL 300 MG/ML  SOLN COMPARISON:  None. FINDINGS: Lower chest: No acute abnormality. Hepatobiliary: No focal liver abnormality is seen. No gallstones, gallbladder wall thickening, or biliary dilatation. Pancreas: Unremarkable. No pancreatic ductal dilatation or surrounding inflammatory changes. Spleen: Normal in size without focal abnormality. Adrenals/Urinary Tract: Adrenal glands appear normal. Kidneys are unremarkable without mass, stone or hydronephrosis. No perinephric fluid. No ureteral or bladder calculi identified. Bladder is decompressed. Stomach/Bowel: No dilated large or small bowel loops. No evidence of bowel wall inflammation. Appendix is partially obscured by overlapping non-opacified bowel loops, but the visualized portion of the appendix is not distended and there are no inflammatory changes about the cecum to suggest acute appendicitis. Stomach is unremarkable. Vascular/Lymphatic: No significant vascular findings are present. No enlarged abdominal or pelvic lymph nodes. Reproductive: Uterus and bilateral adnexa are unremarkable. Other: No free fluid or abscess collection. No free intraperitoneal air. Musculoskeletal: No acute or suspicious osseous finding. Small periumbilical abdominal wall hernia which contains fat only. IMPRESSION: No acute findings within the abdomen or pelvis. No bowel obstruction or evidence of bowel wall inflammation. No free fluid. No renal or ureteral calculi. No evidence of appendicitis. Electronically Signed   By: Franki Cabot M.D.   On: 11/05/2018 17:02   Dg Chest Port 1 View  Result Date: 11/05/2018 CLINICAL DATA:  Severe abdominal pain and nausea.  Tachycardia. EXAM: PORTABLE CHEST 1 VIEW COMPARISON:  None. FINDINGS: Cardiomediastinal silhouette is within normal limits in size and configuration. Lungs are clear. Lung volumes are normal. No evidence of pneumonia. No pleural effusion. No pneumothorax seen. Osseous structures about the chest are unremarkable. IMPRESSION: No active disease. Electronically Signed   By: Franki Cabot M.D.   On: 11/05/2018 15:54    Procedures Procedures (including critical care time)  Medications Ordered in ED Medications  sodium chloride flush (NS) 0.9 % injection 3 mL (3 mLs Intravenous Given 11/05/18 1515)  ondansetron (ZOFRAN) injection 4 mg (4 mg Intravenous Given 11/05/18 1506)  morphine 4 MG/ML injection 4 mg (4 mg Intravenous Given 11/05/18 1506)  sodium chloride 0.9 % bolus 1,000 mL (1,000 mLs Intravenous New Bag/Given 11/05/18 1505)  iohexol (OMNIPAQUE) 300 MG/ML solution 100 mL (100 mLs Intravenous Contrast Given 11/05/18 1634)  morphine 4 MG/ML injection 4 mg (4 mg Intravenous Given 11/05/18 1740)  ondansetron (ZOFRAN) injection 4 mg (4 mg Intravenous Given 11/05/18 1740)     Initial Impression / Assessment and Plan / ED Course  I have  reviewed the triage vital signs and the nursing notes.  Pertinent labs & imaging results that were available during my care of the patient were reviewed by me and considered in my medical decision making (see chart for details).        Patient here for evaluation of generalized abdominal pain. She does have significant upper abdominal tenderness on examination. She is tachycardic on ED arrival, heart rate did improve after pain medications and IV fluids. CT abdomen pelvis is negative for acute process. Given persistent pain it will check on ultrasound to rule out cholecystitis.  Pt care transferred pending [place].    Final Clinical Impressions(s) / ED Diagnoses   Final diagnoses:  RUQ abdominal pain    ED Discharge Orders    None       Quintella Reichert, MD  11/05/18 [phone]

## 2018-11-05 NOTE — Telephone Encounter (Signed)
Patient calling with complaints of severe pain around the umbilicus that has been constant for the past 30 min. Pt states she also experienced the pain yesterday after midnight and stated that she took some anti gas pill, TUMS,Tylenol and a heating pad and eventually fell asleep. Pt currently rates pain 7 and states that it is a searing/burning constant pain. Pt also states that she did have some nausea as well but was able to eat a chicken sandwich around 11 am today. Pt advised to seek treatment in the ED for current symptoms. Pt verbalized understanding.   Reason for Disposition . [1] SEVERE pain (e.g., excruciating) AND [2] present > 1 hour  Answer Assessment - Initial Assessment Questions 1. LOCATION: "Where does it hurt?"      Middle of abdomen around umbilical area 2. RADIATION: "Does the pain shoot anywhere else?" (e.g., chest, back)     No 3. ONSET: "When did the pain begin?" (e.g., minutes, hours or days ago)      Began yesterday after eating worse and became worse. Pt states that last night after midnight she beganto have severe pain again but waited because she did not want to go to the ED 4. SUDDEN: "Gradual or sudden onset?"     sudden 5. PATTERN "Does the pain come and go, or is it constant?"    - If constant: "Is it getting better, staying the same, or worsening?"      (Note: Constant means the pain never goes away completely; most serious pain is constant and it progresses)     - If intermittent: "How long does it last?" "Do you have pain now?"     (Note: Intermittent means the pain goes away completely between bouts)     Constant at this time 6. SEVERITY: "How bad is the pain?"  (e.g., Scale 1-10; mild, moderate, or severe)   - MILD (1-3): doesn't interfere with normal activities, abdomen soft and not tender to touch    - MODERATE (4-7): interferes with normal activities or awakens from sleep, tender to touch    - SEVERE (8-10): excruciating pain, doubled over, unable to do  any normal activities     7 7. CAUSE: "What do you think is causing the abdominal pain?"     unknown 9. RELIEVING/AGGRAVATING FACTORS: "What makes it better or worse?" (e.g., movement, antacids, bowel movement)     Pt states she tried ant gas pills, TUMS and Tylenol last night. Pt states she does not know if it mad the pain better because she eventually fell asleep. Pt also used heating pad 10. OTHER SYMPTOMS: "Has there been any vomiting, diarrhea, constipation, or urine problems?"       Last BM-today which was normal and nausea  Protocols used: ABDOMINAL PAIN - [facility]

## 2018-11-05 NOTE — Discharge Instructions (Addendum)

## 2018-11-05 NOTE — ED Provider Notes (Signed)
Patient care assumed from Dr. Ralene Bathe at shift change with plan to follow-up on pending results of right upper quadrant ultrasound.  Briefly, patient presented with epigastric abdominal pain that began yesterday.  Pain waxed and waned.  No fevers, vomiting, diarrhea or dysuria.  Physical Exam  BP 131/81 (BP Location: Left Arm)   Pulse (!) 110   Temp 98.5 F (36.9 C) (Oral)   Resp 18   SpO2 99%   Physical Exam Vitals signs and nursing note reviewed.  Constitutional:      General: She is not in acute distress.    Appearance: She is well-developed. She is not ill-appearing or toxic-appearing.  HENT:     Head: Normocephalic and atraumatic.  Eyes:     Conjunctiva/sclera: Conjunctivae normal.  Neck:     Musculoskeletal: Neck supple.  Cardiovascular:     Rate and Rhythm: Normal rate and regular rhythm.     Heart sounds: No murmur.     Comments: Heart rate 80s on monitor. Pulmonary:     Effort: Pulmonary effort is normal. No respiratory distress.     Breath sounds: Normal breath sounds.  Abdominal:     Palpations: Abdomen is soft.     Tenderness: There is no guarding or rebound.     Comments: Mild epigastric tenderness  Skin:    General: Skin is warm and dry.  Neurological:     Mental Status: She is alert.     ED Course/Procedures     Procedures   Results for orders placed or performed during the hospital encounter of 11/05/18  Lipase, blood  Result Value Ref Range   Lipase 26 11 - 51 U/L  Comprehensive metabolic panel  Result Value Ref Range   Sodium 141 135 - 145 mmol/L   Potassium 4.4 3.5 - 5.1 mmol/L   Chloride 103 98 - 111 mmol/L   CO2 24 22 - 32 mmol/L   Glucose, Bld 106 (H) 70 - 99 mg/dL   BUN 9 6 - 20 mg/dL   Creatinine, Ser 0.91 0.44 - 1.00 mg/dL   Calcium 9.8 8.9 - 10.3 mg/dL   Total Protein 7.5 6.5 - 8.1 g/dL   Albumin 4.0 3.5 - 5.0 g/dL   AST 21 15 - 41 U/L   ALT 16 0 - 44 U/L   Alkaline Phosphatase 87 38 - 126 U/L   Total Bilirubin 0.4 0.3 - 1.2  mg/dL   GFR calc non Af Amer >60 >60 mL/min   GFR calc Af Amer >60 >60 mL/min   Anion gap 14 5 - 15  CBC  Result Value Ref Range   WBC 8.4 4.0 - 10.5 K/uL   RBC 5.07 3.87 - 5.11 MIL/uL   Hemoglobin 13.8 12.0 - 15.0 g/dL   HCT 42.9 36.0 - 46.0 %   MCV 84.6 80.0 - 100.0 fL   MCH 27.2 26.0 - 34.0 pg   MCHC 32.2 30.0 - 36.0 g/dL   RDW 12.4 11.5 - 15.5 %   Platelets 316 150 - 400 K/uL   nRBC 0.0 0.0 - 0.2 %  Urinalysis, Routine w reflex microscopic  Result Value Ref Range   Color, Urine YELLOW YELLOW   APPearance CLEAR CLEAR   Specific Gravity, Urine 1.023 1.005 - 1.030   pH 5.0 5.0 - 8.0   Glucose, UA NEGATIVE NEGATIVE mg/dL   Hgb urine dipstick NEGATIVE NEGATIVE   Bilirubin Urine NEGATIVE NEGATIVE   Ketones, ur NEGATIVE NEGATIVE mg/dL   Protein, ur NEGATIVE NEGATIVE mg/dL  Nitrite NEGATIVE NEGATIVE   Leukocytes,Ua NEGATIVE NEGATIVE  I-Stat beta hCG blood, ED  Result Value Ref Range   I-stat hCG, quantitative <5.0 <5 mIU/mL   Comment 3           Ct Abdomen Pelvis W Contrast  Result Date: 11/05/2018 CLINICAL DATA:  Severe mid abdominal pain, tachycardia. EXAM: CT ABDOMEN AND PELVIS WITH CONTRAST TECHNIQUE: Multidetector CT imaging of the abdomen and pelvis was performed using the standard protocol following bolus administration of intravenous contrast. CONTRAST:  132mL OMNIPAQUE IOHEXOL 300 MG/ML  SOLN COMPARISON:  None. FINDINGS: Lower chest: No acute abnormality. Hepatobiliary: No focal liver abnormality is seen. No gallstones, gallbladder wall thickening, or biliary dilatation. Pancreas: Unremarkable. No pancreatic ductal dilatation or surrounding inflammatory changes. Spleen: Normal in size without focal abnormality. Adrenals/Urinary Tract: Adrenal glands appear normal. Kidneys are unremarkable without mass, stone or hydronephrosis. No perinephric fluid. No ureteral or bladder calculi identified. Bladder is decompressed. Stomach/Bowel: No dilated large or small bowel loops. No  evidence of bowel wall inflammation. Appendix is partially obscured by overlapping non-opacified bowel loops, but the visualized portion of the appendix is not distended and there are no inflammatory changes about the cecum to suggest acute appendicitis. Stomach is unremarkable. Vascular/Lymphatic: No significant vascular findings are present. No enlarged abdominal or pelvic lymph nodes. Reproductive: Uterus and bilateral adnexa are unremarkable. Other: No free fluid or abscess collection. No free intraperitoneal air. Musculoskeletal: No acute or suspicious osseous finding. Small periumbilical abdominal wall hernia which contains fat only. IMPRESSION: No acute findings within the abdomen or pelvis. No bowel obstruction or evidence of bowel wall inflammation. No free fluid. No renal or ureteral calculi. No evidence of appendicitis. Electronically Signed   By: Franki Cabot M.D.   On: 11/05/2018 17:02   Dg Chest Port 1 View  Result Date: 11/05/2018 CLINICAL DATA:  Severe abdominal pain and nausea. Tachycardia. EXAM: PORTABLE CHEST 1 VIEW COMPARISON:  None. FINDINGS: Cardiomediastinal silhouette is within normal limits in size and configuration. Lungs are clear. Lung volumes are normal. No evidence of pneumonia. No pleural effusion. No pneumothorax seen. Osseous structures about the chest are unremarkable. IMPRESSION: No active disease. Electronically Signed   By: Franki Cabot M.D.   On: 11/05/2018 15:54   US Abdomen Limited Ruq  Result Date: 11/05/2018 CLINICAL DATA:  Right upper quadrant abdominal pain EXAM: ULTRASOUND ABDOMEN LIMITED RIGHT UPPER QUADRANT COMPARISON:  11/05/2018 CT abdomen pelvis FINDINGS: Gallbladder: No gallstones or wall thickening visualized. No sonographic Murphy sign noted by sonographer. Common bile duct: Diameter: 4 mm Liver: No focal lesion identified. Within normal limits in parenchymal echogenicity. Portal vein is patent on color Doppler imaging with normal direction of blood flow  towards the liver. IMPRESSION: Normal right upper quadrant ultrasound. Electronically Signed   By: Ulyses Jarred M.D.   On: 11/05/2018 19:23     MDM    Patient presenting the ED complaining of epigastric abdominal pain that began yesterday.  Afebrile.  Initially somewhat tachycardic however that resolved prior to discharge.  Remainder of vital signs are stable.  Labs are reassuring with no evidence of leukocytosis.  Electrolytes are within normal limits.  Kidney and liver function are normal.  Lipase is negative.  Beta hCG is negative.  UA is negative for urinary tract infection. She has no rigidity, rebound or guarding.  Abdomen is soft.  Chest x-ray is negative.  CT of the abdomen pelvis is negative for acute abnormality.  Right upper quadrant ultrasound is negative. Patient given  pain medication, antiemetics and IV fluids in the ED and had much improvement of symptoms.  On reevaluation patient states symptoms are minimal now and rates pain at 3/10.  She has minimal epigastric tenderness.  She is in no acute distress.  She is nontoxic appearing.  We will give her a prescription for Protonix.  I have advised her to follow-up with her PCP in regards to symptoms.  I have advised to return to the ER for any new or worsening symptoms.  She voices understanding of the plan and reasons to return to the ED.  All questions answered.  Patient stable for discharge.    Bishop Dublin 11/05/18 2010    Quintella Reichert, MD 11/06/18 1113

## 2018-11-05 NOTE — ED Triage Notes (Signed)
Pt states she began to have severe mid abd pain 10/10 denies and v/d. Pt is tachycardic 150s'. Pt states her only history is she was doing IVF until 1 month ago.

## 2018-11-08 ENCOUNTER — Other Ambulatory Visit: Payer: Self-pay | Admitting: Family Medicine

## 2018-11-08 ENCOUNTER — Encounter: Payer: Self-pay | Admitting: Family Medicine

## 2018-11-08 ENCOUNTER — Ambulatory Visit (INDEPENDENT_AMBULATORY_CARE_PROVIDER_SITE_OTHER): Payer: Managed Care, Other (non HMO) | Admitting: Family Medicine

## 2018-11-08 ENCOUNTER — Other Ambulatory Visit: Payer: Self-pay

## 2018-11-08 DIAGNOSIS — R1033 Periumbilical pain: Secondary | ICD-10-CM

## 2018-11-08 MED ORDER — ONDANSETRON HCL 4 MG PO TABS: 4.0000 mg | ORAL_TABLET | Freq: Three times a day (TID) | ORAL | 0 refills | Status: DC | PRN

## 2018-11-08 MED ORDER — PANTOPRAZOLE SODIUM 40 MG PO TBEC: DELAYED_RELEASE_TABLET | ORAL | 3 refills | Status: DC

## 2018-11-08 MED ORDER — DICYCLOMINE HCL 20 MG PO TABS: 20.0000 mg | ORAL_TABLET | Freq: Three times a day (TID) | ORAL | 0 refills | Status: DC

## 2018-11-08 MED ORDER — FAMOTIDINE 20 MG PO TABS: 20.0000 mg | ORAL_TABLET | Freq: Two times a day (BID) | ORAL | 0 refills | Status: DC

## 2018-11-08 NOTE — Telephone Encounter (Signed)
Virtual Visit scheduled °

## 2018-11-08 NOTE — Progress Notes (Addendum)
Virtual Visit via Video Note  I connected with patient on 11/04/18 at  1:15 PM EDT by a video enabled telemedicine application and verified that I am speaking with the correct person using two identifiers.   I discussed the limitations of evaluation and management by telemedicine and the availability of in person appointments. The patient expressed understanding and agreed to proceed.  History of Present Illness: Periumbilical abd pain for around 1 week. Went to ED and had neg [place] and CT. +nausea, cramping. Eating/drinking makes it worse. Started on Protonix 20 mg/d without relief. Does not use NSAIDs/anti-platelets routinely. No hx of H pylori. +nighttime awakenings, denies bleeding, fevers, sick contacts, med changes or recent travel.    Observations/Objective: No conversational dyspnea Age appropriate judgment and insight Nml affect and mood  Assessment and Plan: Periumbilical abdominal pain - Plan: dicyclomine (BENTYL) 20 MG tablet, pantoprazole (PROTONIX) 40 MG tablet, famotidine (PEPCID) 20 MG tablet, ondansetron (ZOFRAN) 4 MG tablet, Ambulatory referral to Gastroenterology  Orders as above. Bentyl for cramping, increase PPI dose, add H2, Zofran for nausea. Refer to GI for possible scope.   Follow Up Instructions: Prn with me.    I discussed the assessment and treatment plan with the patient. The patient was provided an opportunity to ask questions and all were answered. The patient agreed with the plan and demonstrated an understanding of the instructions.   The patient was advised to call back or seek an in-person evaluation if the symptoms worsen or if the condition fails to improve as anticipated.   Mathews, DO

## 2018-11-08 NOTE — Telephone Encounter (Signed)
Follow up call made to patient. No answer unable to leave message for return call.

## 2018-11-09 ENCOUNTER — Other Ambulatory Visit: Payer: Self-pay

## 2018-11-09 ENCOUNTER — Encounter: Payer: Self-pay | Admitting: Gastroenterology

## 2018-11-09 ENCOUNTER — Ambulatory Visit (INDEPENDENT_AMBULATORY_CARE_PROVIDER_SITE_OTHER): Payer: Managed Care, Other (non HMO) | Admitting: Gastroenterology

## 2018-11-09 VITALS — Ht 68.0 in | Wt 210.0 lb

## 2018-11-09 DIAGNOSIS — K219 Gastro-esophageal reflux disease without esophagitis: Secondary | ICD-10-CM

## 2018-11-09 DIAGNOSIS — R1033 Periumbilical pain: Secondary | ICD-10-CM | POA: Diagnosis not present

## 2018-11-09 DIAGNOSIS — R112 Nausea with vomiting, unspecified: Secondary | ICD-10-CM

## 2018-11-09 NOTE — Progress Notes (Signed)
GASTROENTEROLOGY TELEMEDICINE OUTPATIENT CLINIC VISIT   Primary Care Provider Shelda Pal, [place] [address] [phone]  Referring Provider Shelda Pal, [place], [place] [address] [phone]  Patient Profile: Wanda Acosta is a 28 y.o. female with a pmh significant for GERD, Obesity.  The patient presents to the [facility] for an evaluation and management of problem(s) noted below:  Problem List 1. Acute periumbilical pain   2. Non-intractable vomiting with nausea, unspecified vomiting type   3. Gastroesophageal reflux disease, esophagitis presence not specified     History of Present Illness: Due to the COVID-19 Pandemic, this service was provided via telemedicine using Audio/Visual Media. The patient was located at home. The provider was located in the office. The patient did consent to this visit and is aware of charges through their insurance. The patient was referred by PCP as above. Other persons participating in this telemedicine service were none. Time spent on visit and coordination of care was 45 minutes.  This is the patient's first visit to the [place] clinic via telemedicine.  The patient states that she has previously seen gastroenterology there is been many years ago when she was told she may have irritable bowel syndrome as well as GERD issues.  This was around the time of 1 of her pregnancies.  She otherwise has done relatively well over the course of time and is never had an upper or lower endoscopic evaluation.  She describes an acute abdominal pain which she has not experienced previously.  She ended up having to go to the emergency department and had follow-up with her primary care provider yesterday who subsequently put in for an urgent consultation that was set up for today.  Patient describes that last Thursday she had new onset abdominal pain.   Occurred throughout the abdomen itself but later on localized closer to the region of her umbilicus.  Over the course of the day on Thursday she decided to try and stated above going to the hospital for further evaluation however by Friday things have worsened to the point where she had to go in.  She describes significant nausea and a sensation of food not settling in her stomach but she did not have actual vomitus.  She is tried taking Tylenol to help with the discomfort and that has helped minimally.  On Wednesday she ate a bagel of her cereal for breakfast and then had a [place] sandwich for lunch and then sloppy Joe's for dinner.  She describes no one else in her family including her children and husband having any similar symptoms.  She has not had any fevers.  After being seen in the ED and then subsequently follow-up with her primary care provider, she was initiated on high-dose PPI, and antiemetic, and antacid, and Bentyl.  Wilburn Mylar is when she saw her primary care provider and today's the first day that she is taking 40 mg total of PPI with plan for 40 mg twice daily.  She was referred urgently to consider the role of endoscopic evaluation since she had a CT abdomen/pelvis as well as right upper quadrant ultrasound that were unremarkable.  Laboratory evaluation at the hospital/emergency department showed no evidence of acute findings and no evidence of pancreatitis or significant electrolyte disturbances.  She is currently using Zofran every 8 hours.  2 to 3 months ago her bowel habits were normal meaning she was having a  bowel movement 1-2 times daily.  Every few weeks she will have a day of diarrhea and then that will subside.  She has not had pain associated with that previously.  For her GERD symptoms she is usually on Tagamet which is been helpful for her to control her heartburn symptoms.  She denies any overt dysphagia or odynophagia.  She does not take nonsteroidals on a frequent basis.  She has  not lost any weight since the initiation of this particular episode of abdominal discomfort.  GI Review of Systems Positive as above including decreased appetite Negative for hematemesis, melena, hematochezia, change in bowel habits  Review of Systems General: Denies fevers/chills/weight loss HEENT: Denies oral lesions Cardiovascular: Denies chest pain Pulmonary: Denies shortness of breath Gastroenterological: See HPI Genitourinary: Denies darkened urine Hematological: Denies easy bruising/bleeding Endocrine: Denies temperature intolerance Dermatological: Denies jaundice Psychological: Mood is stable   Medications Current Outpatient Medications  Medication Sig Dispense Refill   dicyclomine (BENTYL) 20 MG tablet Take 1 tablet (20 mg total) by mouth 4 (four) times daily -  before meals and at bedtime. 30 tablet 0   famotidine (PEPCID) 20 MG tablet Take 1 tablet (20 mg total) by mouth 2 (two) times daily. 60 tablet 0   ondansetron (ZOFRAN) 4 MG tablet Take 1 tablet (4 mg total) by mouth every 8 (eight) hours as needed. 20 tablet 0   pantoprazole (PROTONIX) 40 MG tablet Take 1 tab twice daily for 7 days and then 1 tab daily. 30 tablet 3   No current facility-administered medications for this visit.     Allergies No Known Allergies  Histories Past Medical History:  Diagnosis Date   GERD (gastroesophageal reflux disease)    No known health problems    Obesity    Past Surgical History:  Procedure Laterality Date   OTHER SURGICAL HISTORY     Finger reattachment   Social History   Socioeconomic History   Marital status: Married    Spouse name: Not on file   Number of children: Not on file   Years of education: Not on file   Highest education level: Not on file  Occupational History   Not on file  Social Needs   Financial resource strain: Not on file   Food insecurity:    Worry: Not on file    Inability: Not on file   Transportation needs:     Medical: Not on file    Non-medical: Not on file  Tobacco Use   Smoking status: Never Smoker   Smokeless tobacco: Never Used  Substance and Sexual Activity   Alcohol use: Never    Frequency: Never   Drug use: Never   Sexual activity: Yes    Partners: Male  Lifestyle   Physical activity:    Days per week: Not on file    Minutes per session: Not on file   Stress: Not on file  Relationships   Social connections:    Talks on phone: Not on file    Gets together: Not on file    Attends religious service: Not on file    Active member of club or organization: Not on file    Attends meetings of clubs or organizations: Not on file    Relationship status: Not on file   Intimate partner violence:    Fear of current or ex partner: Not on file    Emotionally abused: Not on file    Physically abused: Not on file    Forced  sexual activity: Not on file  Other Topics Concern   Not on file  Social History Narrative   Not on file   Family History  Problem Relation Age of Onset   Diabetes Maternal Grandfather    Colon cancer Paternal Grandfather    Esophageal cancer Neg Hx    Inflammatory bowel disease Neg Hx    Liver disease Neg Hx    Pancreatic cancer Neg Hx    Stomach cancer Neg Hx    I have reviewed her medical, social, and family history in detail and updated the electronic medical record as necessary.    PHYSICAL EXAMINATION  Telemedicine visit Based on patient's abdominal exam at time of telemedicine visit she describes tenderness to palpation in the region of her peri-umbilicus as well as under her umbilicus upon deep palpation but otherwise does not show evidence of rebound or guarding based on her exam over the A/V media  REVIEW OF DATA  I reviewed the following data at the time of this encounter:  GI Procedures and Studies  No relevant studies to review  Laboratory Studies  Reviewed in epic from recent ED visit  Imaging Studies  November 05, 2018 CT  abdomen pelvis with contrast IMPRESSION: No acute findings within the abdomen or pelvis. No bowel obstruction or evidence of bowel wall inflammation. No free fluid. No renal or ureteral calculi. No evidence of appendicitis.  November 05, 2018 right upper quadrant ultrasound FINDINGS: Gallbladder: No gallstones or wall thickening visualized. No sonographic Murphy sign noted by sonographer. Common bile duct: Diameter: 4 mm Liver: No focal lesion identified. Within normal limits in parenchymal echogenicity. Portal vein is patent on color Doppler imaging with normal direction of blood flow towards the liver. IMPRESSION: Normal right upper quadrant ultrasound.  11/05/2018 chest x-ray IMPRESSION: No active disease.   ASSESSMENT  Ms. Lisowski is a 28 y.o. female  with a pmh significant for GERD, Obesity.  The patient is seen today for evaluation and management of:  1. Acute periumbilical pain   2. Non-intractable vomiting with nausea, unspecified vomiting type   3. Gastroesophageal reflux disease, esophagitis presence not specified    The patient seems to be resting at home and does not seem to be in any acute distress.  With that being said clinically there has been some sort of etiology of pain that has come out of nowhere.  I am not completely clear as to what this is but she has had a relatively extensive acute work-up including CT scans and ultrasounds that have ruled out significant disease processes and were performed a few days after her if she had a pancreatitis or other etiologies such as appendicitis we should have seen it at that point in time.  Laboratory and electrolyte evaluation was normal.  I suspect that this is some sort of viral gastroenteritis that is occurring currently.  Based on her symptoms I do not overtly think that COVID-19 is an issue at this time as she is also been staying home and working from home for the last month.  With that being said I do think that we should  optimize her acid suppression for the next few weeks to see how she does and if she continues to have significant symptoms over the course the next few weeks we may have to consider a diagnostic upper endoscopy to evaluate for symptoms of peptic ulcer disease as well as other processes.  I am going to make some changes to her medications such that  we try and optimize her antiemetics as well as her Bentyl usage.  All patient questions were answered, to the best of my ability, and the patient agrees to the aforementioned plan of action with follow-up as indicated.   PLAN  Stop Pepcid Continue twice daily PPI 40 mg (new prescription sent) Zofran can be used up to 3 times a day every 8 hours but I would ideally try to just use it twice a day and have the other dose used on an as-needed basis Dental can be used up to 4 times daily but I would ideally try to use once at breakfast time once at dinner and have the 2 other doses available should she have cramping/discomfort occur If the patient still having symptoms at follow-up would consider repeat laboratory evaluation including amylase/lipase/ESR/CRP/H. pylori evaluation (consider she is already on PPI) We will follow-up in approximately 3 to 4 weeks based on patient's symptoms if she continues to have issues we may need to consider a diagnostic evaluation including upper endoscopy and/for colonoscopy as she has had an extensive work-up for acute illness   No orders of the defined types were placed in this encounter.   New Prescriptions   No medications on file   Modified Medications   No medications on file    Planned Follow Up: No follow-ups on file.   Justice Britain, MD [facility] Office # [phone]

## 2018-11-10 ENCOUNTER — Encounter: Payer: Self-pay | Admitting: Gastroenterology

## 2018-11-10 DIAGNOSIS — R1033 Periumbilical pain: Secondary | ICD-10-CM | POA: Insufficient documentation

## 2018-11-10 DIAGNOSIS — R1110 Vomiting, unspecified: Secondary | ICD-10-CM | POA: Insufficient documentation

## 2018-11-11 ENCOUNTER — Telehealth: Payer: Self-pay | Admitting: Gastroenterology

## 2018-11-11 DIAGNOSIS — R1033 Periumbilical pain: Secondary | ICD-10-CM

## 2018-11-11 MED ORDER — PANTOPRAZOLE SODIUM 40 MG PO TBEC: 40.0000 mg | DELAYED_RELEASE_TABLET | Freq: Two times a day (BID) | ORAL | 3 refills | Status: DC

## 2018-11-11 NOTE — Telephone Encounter (Signed)
Patient said she is having constant diarrhea since yesterday and does not know if its due to the medication and would like to speak to someone. Also, called and said that her medication for PROTONIX was not sent in.

## 2018-11-11 NOTE — Telephone Encounter (Signed)
The pt called in for prescription of Protonix and continued diarrhea.  She was advised to take bentyl 4 times daily as  prescribed. (was only taking as needed.) She will take imodium for diarrhea and will call back if no better by Monday.  She has a follow up phone call on 5/7.

## 2018-11-24 ENCOUNTER — Other Ambulatory Visit: Payer: Self-pay

## 2018-11-24 DIAGNOSIS — K219 Gastro-esophageal reflux disease without esophagitis: Secondary | ICD-10-CM

## 2018-11-24 DIAGNOSIS — R1033 Periumbilical pain: Secondary | ICD-10-CM

## 2018-11-24 DIAGNOSIS — R112 Nausea with vomiting, unspecified: Secondary | ICD-10-CM

## 2018-11-26 ENCOUNTER — Other Ambulatory Visit (INDEPENDENT_AMBULATORY_CARE_PROVIDER_SITE_OTHER): Payer: Managed Care, Other (non HMO)

## 2018-11-26 DIAGNOSIS — K219 Gastro-esophageal reflux disease without esophagitis: Secondary | ICD-10-CM | POA: Diagnosis not present

## 2018-11-26 DIAGNOSIS — R112 Nausea with vomiting, unspecified: Secondary | ICD-10-CM

## 2018-11-26 DIAGNOSIS — R1033 Periumbilical pain: Secondary | ICD-10-CM | POA: Diagnosis not present

## 2018-11-26 LAB — CORTISOL: Cortisol, Plasma: 6.4 ug/dL

## 2018-11-26 LAB — AMYLASE: Amylase: 32 U/L (ref 27–131)

## 2018-11-26 LAB — TSH: TSH: 1 u[IU]/mL (ref 0.35–4.50)

## 2018-11-26 LAB — HIGH SENSITIVITY CRP: CRP, High Sensitivity: 10.65 mg/L — ABNORMAL HIGH (ref 0.000–5.000)

## 2018-11-26 LAB — LIPASE: Lipase: 2 U/L — ABNORMAL LOW (ref 11.0–59.0)

## 2018-11-30 ENCOUNTER — Encounter: Payer: Self-pay | Admitting: General Surgery

## 2018-11-30 ENCOUNTER — Other Ambulatory Visit: Payer: Self-pay

## 2018-11-30 ENCOUNTER — Ambulatory Visit (INDEPENDENT_AMBULATORY_CARE_PROVIDER_SITE_OTHER): Payer: Managed Care, Other (non HMO) | Admitting: Gastroenterology

## 2018-11-30 VITALS — Ht 68.0 in | Wt 210.0 lb

## 2018-11-30 DIAGNOSIS — R1033 Periumbilical pain: Secondary | ICD-10-CM | POA: Diagnosis not present

## 2018-11-30 DIAGNOSIS — R110 Nausea: Secondary | ICD-10-CM | POA: Diagnosis not present

## 2018-11-30 DIAGNOSIS — R109 Unspecified abdominal pain: Secondary | ICD-10-CM

## 2018-11-30 DIAGNOSIS — R194 Change in bowel habit: Secondary | ICD-10-CM | POA: Diagnosis not present

## 2018-11-30 MED ORDER — PEG 3350-KCL-NA BICARB-NACL 420 G PO SOLR: 4000.0000 mL | Freq: Once | ORAL | 0 refills | Status: AC

## 2018-11-30 MED ORDER — DICYCLOMINE HCL 20 MG PO TABS: 20.0000 mg | ORAL_TABLET | Freq: Three times a day (TID) | ORAL | 2 refills | Status: DC

## 2018-11-30 NOTE — Patient Instructions (Addendum)
If you are age 30 or older, your body mass index should be between 23-30. Your Body mass index is 31.93 kg/m. If this is out of the aforementioned range listed, please consider follow up with your Primary Care Provider.  If you are age 87 or younger, your body mass index should be between 19-25. Your Body mass index is 31.93 kg/m. If this is out of the aformentioned range listed, please consider follow up with your Primary Care Provider.   You have been scheduled for an endoscopy and colonoscopy. Please follow the written instructions given to you at your visit today. Please pick up your prep supplies at the pharmacy within the next 1-3 days. If you use inhalers (even only as needed), please bring them with you on the day of your procedure. Your physician has requested that you go to www.startemmi.com and enter the access code given to you at your visit today. This web site gives a general overview about your procedure. However, you should still follow specific instructions given to you by our office regarding your preparation for the procedure.  Your provider has requested that you go to the basement level for lab work the day of procedure. Press "B" on the elevator. The lab is located at the first door on the left as you exit the elevator.   Discontinue Zofran and Pepcid. Continue Protonix twice daily.   We have sent the following medications to your pharmacy for you to pick up at your convenience: Bentyl   Thank you for choosing me and [facility].  Dr. Rush Landmark

## 2018-11-30 NOTE — Progress Notes (Signed)
GASTROENTEROLOGY TELEMEDICINE OUTPATIENT CLINIC VISIT   Primary Care Provider Shelda Pal, [place] [address] [phone]  Patient Profile: Wanda Acosta is a 28 y.o. female with a pmh significant for GERD, Obesity.  The patient presents to the [facility] for an evaluation and management of problem(s) noted below:  Problem List 1. Periumbilical abdominal pain   2. Nausea without vomiting   3. Change in bowel habits     History of Present Illness: Please see initial consultation note for full details of HPI.  Due to the COVID-19 Pandemic, this service was provided via telemedicine using Audio/Visual Media. The patient was located at home. The provider was located in the office. The patient did consent to this visit and is aware of charges through their insurance. The patient was referred by PCP as above. Other persons participating in this telemedicine service were none. Time spent on visit and coordination of care was greater than 25 minutes.  Interval History Today was a scheduled follow-up for the patient.  Overall she states that she is still experiencing the same type of discomfort that she was having before but it is not as severe as prior.  She was still have nausea occurring randomly after meals. She is using Bentyl and thought it helped slightly but has run out of that.  Weight loss is not evident at this point in time.  Pain lasting as long as it has is now moved into a more chronic nature.  He is hopeful that is just going to take time for things to improve.  Denies hematemesis or coffee-ground emesis.  She is having changes in her bowel habits.  While she was taking Zofran she had a little bit more constipation now that she stopped taking the Zofran she is having looseness in her bowel movements.  There is no blood in her bowel movements.  GI Review of Systems Positive as above Negative for melena,  hematochezia early satiety   Review of Systems General: Denies fevers/chills/weight loss Cardiovascular: Denies chest pain Pulmonary: Denies shortness of breath Gastroenterological: See HPI Genitourinary: Denies darkened urine Hematological: Denies easy bruising Dermatological: Denies jaundice Psychological: Mood is stable but remains anxious due to the longevity of discomfort   Medications Current Outpatient Medications  Medication Sig Dispense Refill  . pantoprazole (PROTONIX) 40 MG tablet Take 1 tablet (40 mg total) by mouth 2 (two) times daily. Take 1 tab twice daily for 7 days and then 1 tab daily. 180 tablet 3  . dicyclomine (BENTYL) 20 MG tablet Take 1 tablet (20 mg total) by mouth 4 (four) times daily -  before meals and at bedtime. 30 tablet 2   No current facility-administered medications for this visit.     Allergies No Known Allergies  Histories Past Medical History:  Diagnosis Date  . GERD (gastroesophageal reflux disease)   . No known health problems   . Obesity    Past Surgical History:  Procedure Laterality Date  . OTHER SURGICAL HISTORY     Finger reattachment   Social History   Socioeconomic History  . Marital status: Married    Spouse name: Tory  . Number of children: 1  . Years of education: Not on file  . Highest education level: Not on file  Occupational History  . Not on file  Social Needs  . Financial resource strain: Not on file  . Food insecurity:    Worry: Not on file  Inability: Not on file  . Transportation needs:    Medical: Not on file    Non-medical: Not on file  Tobacco Use  . Smoking status: Never Smoker  . Smokeless tobacco: Never Used  Substance and Sexual Activity  . Alcohol use: Never    Frequency: Never  . Drug use: Never  . Sexual activity: Yes    Partners: Male  Lifestyle  . Physical activity:    Days per week: Not on file    Minutes per session: Not on file  . Stress: Not on file  Relationships  . Social  connections:    Talks on phone: Not on file    Gets together: Not on file    Attends religious service: Not on file    Active member of club or organization: Not on file    Attends meetings of clubs or organizations: Not on file    Relationship status: Not on file  . Intimate partner violence:    Fear of current or ex partner: Not on file    Emotionally abused: Not on file    Physically abused: Not on file    Forced sexual activity: Not on file  Other Topics Concern  . Not on file  Social History Narrative  . Not on file   Family History  Problem Relation Age of Onset  . Diabetes Maternal Grandfather   . Colon cancer Paternal Grandfather   . Esophageal cancer Neg Hx   . Inflammatory bowel disease Neg Hx   . Liver disease Neg Hx   . Pancreatic cancer Neg Hx   . Stomach cancer Neg Hx    I have reviewed her medical, social, and family history in detail and updated the electronic medical record as necessary.    PHYSICAL EXAMINATION  Telemedicine visit The patient continues to have issues in regards to discomfort in the periumbilical region   REVIEW OF DATA  I reviewed the following data at the time of this encounter:  GI Procedures and Studies  No relevant studies to review  Laboratory Studies  Reviewed in epic  Imaging Studies  No new imaging studies to review   ASSESSMENT  Wanda Acosta is a 28 y.o. female  with a pmh significant for GERD, Obesity.  The patient is seen today for evaluation and management of:  1. Periumbilical abdominal pain   2. Nausea without vomiting   3. Change in bowel habits    Overall, the patient seems to be clinically stable although is continuing to have issues.  She is improved from our initial consultation but is still experiencing issues.  As a result of the transition from a more acute issue to more chronic issues at this point in time and persistent discomfort I think the next steps in her evaluation require a diagnostic upper and lower  endoscopy to evaluate things further.  Her bowel habits have fluctuated continued to be an issue currently although she was feeling like things were less of an issue when she was taking the Zofran more frequently she began to experience symptoms of feeling constipated but since stopping the Zofran she is now having multiple loose bowel movements per day.  The abdominal discomfort is now rated a 6-7 out of 10 as opposed to 9 out of 10 discomfort.  She is tolerating meals but does no particular fashion get nausea without vomiting with meals at times though it is not after every meal.  As such I think a diagnostic endoscopy to obtain  esophageal and gastric and duodenal biopsies is reasonable in the next step in her evaluation.  We will plan to do a colonoscopy to evaluate for microscopic/lymphocytic colitis as well as rule out inflammatory bowel disease in the setting of an elevated high-sensitivity CRP.  That lab will have to be repeated in approximately 3 to 4 weeks.  I think as a result of the COVID-19 pandemic but her persistent symptoms she would benefit from a diagnostic evaluation.  As such I would like to pursue this in the next 3 to 4 weeks.  She should get a repeat high-sensitivity CRP done at the time that her IV is placed and should be sent for [place] to evaluate that.  Overall, this may end up turning out to be a postinfectious viral process with a component of irritable bowel post infection however I think because of the persistence of symptoms we must be vigilant and ensure we are not missing anything further.  May consider the role of stool studies down the road if we do not find an etiology for symptoms.  I would like her to also get a repeat prescription for Bentyl as she did find it somewhat helpful at times.  Not clear that twice daily PPI is actually effective for her symptoms but until the diagnostic endoscopy I think we should continue it at twice daily.  The risks and benefits of endoscopic  evaluation were discussed with the patient; these include but are not limited to the risk of perforation, infection, bleeding, missed lesions, lack of diagnosis, severe illness requiring hospitalization, as well as anesthesia and sedation related illnesses.  The patient is agreeable to proceed.  All patient questions were answered, to the best of my ability, and the patient agrees to the aforementioned plan of action with follow-up as indicated.   PLAN  Continue PPI 40 mg twice daily Holding on antiemetics per patient request but may consider the role of Phenergan or Compazine in future We will re-prescribe Bentyl up to 4 times daily (try to take once at breakfast and once at dinner and have 2 available doses if she has cramping/discomfort) proceed with scheduling diagnostic upper and lower endoscopy (esophageal/gastric/duodenal/TI/colon biopsies) May consider the role of SIBO breath testing in the future   Orders Placed This Encounter  Procedures  . CRP High sensitivity  . Ambulatory referral to Gastroenterology    New Prescriptions   No medications on file   Modified Medications   Modified Medication Previous Medication   DICYCLOMINE (BENTYL) 20 MG TABLET dicyclomine (BENTYL) 20 MG tablet      Take 1 tablet (20 mg total) by mouth 4 (four) times daily -  before meals and at bedtime.    Take 1 tablet (20 mg total) by mouth 4 (four) times daily -  before meals and at bedtime.    Planned Follow Up: No follow-ups on file.   Justice Britain, MD [facility] Office # [phone]

## 2018-12-02 ENCOUNTER — Ambulatory Visit: Payer: Managed Care, Other (non HMO) | Admitting: Gastroenterology

## 2018-12-03 ENCOUNTER — Encounter: Payer: Self-pay | Admitting: Gastroenterology

## 2018-12-03 DIAGNOSIS — R110 Nausea: Secondary | ICD-10-CM | POA: Insufficient documentation

## 2018-12-03 DIAGNOSIS — R194 Change in bowel habit: Secondary | ICD-10-CM | POA: Insufficient documentation

## 2018-12-06 ENCOUNTER — Telehealth: Payer: Self-pay | Admitting: Gastroenterology

## 2018-12-06 NOTE — Telephone Encounter (Signed)
Pls call pt, she has some questions regarding prep.

## 2018-12-07 NOTE — Telephone Encounter (Signed)
Returned patient call. Answered questions regarding prep. Pt c/o issues with dulcolax-pink for women in the past. Abd cramping, pain and bloating. Pt advised to use standard dulcolax- 2 verses 4 and to take 30 mins apart in hopes that will be easier on stomach once patients starts prep.

## 2018-12-10 DIAGNOSIS — F419 Anxiety disorder, unspecified: Secondary | ICD-10-CM

## 2018-12-10 HISTORY — DX: Anxiety disorder, unspecified: F41.9

## 2018-12-15 ENCOUNTER — Other Ambulatory Visit: Payer: Self-pay

## 2018-12-15 ENCOUNTER — Encounter: Payer: Self-pay | Admitting: Family Medicine

## 2018-12-15 ENCOUNTER — Telehealth: Payer: Self-pay | Admitting: Family Medicine

## 2018-12-15 ENCOUNTER — Ambulatory Visit (INDEPENDENT_AMBULATORY_CARE_PROVIDER_SITE_OTHER): Payer: Managed Care, Other (non HMO) | Admitting: Family Medicine

## 2018-12-15 DIAGNOSIS — F418 Other specified anxiety disorders: Secondary | ICD-10-CM

## 2018-12-15 MED ORDER — ESCITALOPRAM OXALATE 10 MG PO TABS: 10.0000 mg | ORAL_TABLET | Freq: Every day | ORAL | 3 refills | Status: DC

## 2018-12-15 MED ORDER — TRAZODONE HCL 50 MG PO TABS: 25.0000 mg | ORAL_TABLET | Freq: Every evening | ORAL | 3 refills | Status: DC | PRN

## 2018-12-15 NOTE — Progress Notes (Addendum)
Chief Complaint  Patient presents with  . Anxiety    Subjective Wanda Acosta is an 28 y.o. female who presents with anxiety/depression. Due to COVID-19 pandemic, we are interacting via web portal for an electronic face-to-face visit. I verified patient's ID using 2 identifiers. Patient agreed to proceed with visit via this method. Patient is at home, I am at office. Patient and I are present for visit.  Symptoms began 1 yr ago, worse over past couple mo. Anxiety symptoms: difficulty concentrating, fatigue, insomnia, irritable, racing thoughts. Possible organic causes contributing are: none Social stressors include work, pandemic, husband w PTSD. She is currently being treated with None; was on Wellbutrin and Zoloft 7 yrs ago, came off on own. Not particularly effective She is not following with a psychologist. Has been drinking more wine on weekends, not during week. No other self medication. No SI or HI.  Past Medical History:  Diagnosis Date  . GERD (gastroesophageal reflux disease)   . No known health problems   . Obesity     Allergies No Known Allergies  Family History Family History  Problem Relation Age of Onset  . Diabetes Maternal Grandfather   . Colon cancer Paternal Grandfather   . Esophageal cancer Neg Hx   . Inflammatory bowel disease Neg Hx   . Liver disease Neg Hx   . Pancreatic cancer Neg Hx   . Stomach cancer Neg Hx      Review Of Systems Cardiovascular:  no chest pain, no palpitations Psychiatric: as noted in HPI  Exam No conversational dyspnea Age appropriate judgment and insight Nml affect and mood  Assessment and Plan  Situational anxiety - Plan: traZODone (DESYREL) 50 MG tablet, escitalopram (LEXAPRO) 10 MG tablet  Orders as above. Lexapro, Trazodone prn.  Counseled on adjunctive treatment with exercise/physical activity. Number for [place] provided in AVS. F/u in 6 weeks. Patient voiced understanding and agreement  to the plan.  [place], DO 12/15/18 10:04 AM

## 2018-12-15 NOTE — Telephone Encounter (Signed)
Pt mailbox full, unable to leave detailed message for pt to call and schedule follow up appt

## 2018-12-21 ENCOUNTER — Telehealth: Payer: Self-pay | Admitting: Family Medicine

## 2018-12-21 ENCOUNTER — Telehealth: Payer: Self-pay | Admitting: *Deleted

## 2018-12-21 NOTE — Telephone Encounter (Signed)
Since Friday patient is having difficulty urinating. Having no hesitancy, no pain/no blood. Patient drinking water and cranberry juice. No fever. Right side abd to back pain

## 2018-12-21 NOTE — Telephone Encounter (Signed)
She can continue that and maybe at [facility] or schedule an appt, ty.

## 2018-12-21 NOTE — Telephone Encounter (Signed)
Covid-19 travel screening questions  Have you traveled in the last 14 days? no If yes where?  Do you now or have you had a fever in the last 14 days? no  Do you have any respiratory symptoms of shortness of breath or cough now or in the last 14 days? no  Do you have any family members or close contacts with diagnosed or suspected Covid-19? No  Pt is aware that care partner will be waiting in the car during her procedure.  She will wear a mask into the building

## 2018-12-21 NOTE — Telephone Encounter (Signed)
Unable to get on the phone and sent the patient a mychart message with PCP instructions.

## 2018-12-21 NOTE — Telephone Encounter (Signed)
Called and unable to get her on the phone

## 2018-12-23 ENCOUNTER — Other Ambulatory Visit: Payer: Self-pay

## 2018-12-23 ENCOUNTER — Encounter: Payer: Self-pay | Admitting: Gastroenterology

## 2018-12-23 ENCOUNTER — Ambulatory Visit (AMBULATORY_SURGERY_CENTER): Payer: Managed Care, Other (non HMO) | Admitting: Gastroenterology

## 2018-12-23 VITALS — BP 142/89 | HR 86 | Temp 98.9°F | Resp 19 | Ht 68.0 in | Wt 210.0 lb

## 2018-12-23 DIAGNOSIS — K648 Other hemorrhoids: Secondary | ICD-10-CM

## 2018-12-23 DIAGNOSIS — K3189 Other diseases of stomach and duodenum: Secondary | ICD-10-CM | POA: Diagnosis not present

## 2018-12-23 DIAGNOSIS — R110 Nausea: Secondary | ICD-10-CM

## 2018-12-23 DIAGNOSIS — R194 Change in bowel habit: Secondary | ICD-10-CM

## 2018-12-23 DIAGNOSIS — K635 Polyp of colon: Secondary | ICD-10-CM

## 2018-12-23 DIAGNOSIS — D127 Benign neoplasm of rectosigmoid junction: Secondary | ICD-10-CM

## 2018-12-23 DIAGNOSIS — K317 Polyp of stomach and duodenum: Secondary | ICD-10-CM

## 2018-12-23 DIAGNOSIS — R109 Unspecified abdominal pain: Secondary | ICD-10-CM

## 2018-12-23 DIAGNOSIS — D124 Benign neoplasm of descending colon: Secondary | ICD-10-CM

## 2018-12-23 MED ORDER — SODIUM CHLORIDE 0.9 % IV SOLN
8.0000 mg | Freq: Once | INTRAVENOUS | Status: AC
  Administered 2018-12-23: 8 mg via INTRAVENOUS

## 2018-12-23 MED ORDER — SODIUM CHLORIDE 0.9 % IV SOLN: 500.0000 mL | Freq: Once | INTRAVENOUS | Status: DC

## 2018-12-23 MED ORDER — ONDANSETRON 8 MG PO TBDP: 8.0000 mg | ORAL_TABLET | Freq: Three times a day (TID) | ORAL | 1 refills | Status: DC | PRN

## 2018-12-23 MED ORDER — PROCHLORPERAZINE 25 MG RE SUPP: 25.0000 mg | Freq: Two times a day (BID) | RECTAL | 1 refills | Status: DC | PRN

## 2018-12-23 MED ORDER — PROMETHAZINE HCL 25 MG/ML IJ SOLN: 25.0000 mg | Freq: Once | INTRAMUSCULAR | Status: DC

## 2018-12-23 NOTE — Progress Notes (Signed)
Patient has been reevaluated on multiple occasions ablation of her procedures. She is having significant nausea and dry retching spit up. No overt vomiting/emesis. She complains of no new abdominal discomforts or significant distention of her abdomen. She has been given Zofran 8 mg and will be given 25 mg of Phenergan. She is having another liter of IV fluids given. Patient continues to have significant nausea and we are unable to control this then she may require an emergency department visit. I have spoken and updated both her and her husband who is now at her side. We will reevaluate in the next hour.  Justice Britain, MD [facility] Office # [phone]

## 2018-12-23 NOTE — Progress Notes (Signed)
Report given to PACU, vss 

## 2018-12-23 NOTE — Progress Notes (Signed)
Patient is dry heaving at this time.  At 3pm she was given 25mg  of phenergan in 40 ml NS stat ordered by dr. Rush Landmark.  Patient's IV is wide open.  Sinus Tach noted.    Post phenergan, patient is wretching much less.  Crackers given.  States feeling "a little better."  Phenergan Lot [id number], Exp 10/20 Made by [facility] ward co.  Minutes after cracker given, she told me that she just couldn't swallow it and spit it out.  States really "Not better at this point."  Dr. Rush Landmark notifed.  [place] Dr. Jerilynn Mages to evaluate.  60 Dr. Speaking to patient and ordered Benadryl 25mg  IV stat.  Med given as ordered. Lot #[id number], exp 2/21, APP Higher education careers adviser.  1440 Patient s sleeping without retching.    1510 Patient was discharged.

## 2018-12-23 NOTE — Patient Instructions (Signed)
Information on polyps and hemorrhoids given to you today.  Await pathology results.  May transition to once a day PPI.  YOU HAD AN ENDOSCOPIC PROCEDURE TODAY AT [facility]:   Refer to the procedure report that was given to you for any specific questions about what was found during the examination.  If the procedure report does not answer your questions, please call your gastroenterologist to clarify.  If you requested that your care partner not be given the details of your procedure findings, then the procedure report has been included in a sealed envelope for you to review at your convenience later.  YOU SHOULD EXPECT: Some feelings of bloating in the abdomen. Passage of more gas than usual.  Walking can help get rid of the air that was put into your GI tract during the procedure and reduce the bloating. If you had a lower endoscopy (such as a colonoscopy or flexible sigmoidoscopy) you may notice spotting of blood in your stool or on the toilet paper. If you underwent a bowel prep for your procedure, you may not have a normal bowel movement for a few days.  Please Note:  You might notice some irritation and congestion in your nose or some drainage.  This is from the oxygen used during your procedure.  There is no need for concern and it should clear up in a day or so.  SYMPTOMS TO REPORT IMMEDIATELY:   Following lower endoscopy (colonoscopy or flexible sigmoidoscopy):  Excessive amounts of blood in the stool  Significant tenderness or worsening of abdominal pains  Swelling of the abdomen that is new, acute  Fever of 100F or higher   Following upper endoscopy (EGD)  Vomiting of blood or coffee ground material  New chest pain or pain under the shoulder blades  Painful or persistently difficult swallowing  New shortness of breath  Fever of 100F or higher  Black, tarry-looking stools  For urgent or emergent issues, a gastroenterologist can be reached at any hour by  calling [phone].   DIET:  We do recommend a small meal at first, but then you may proceed to your regular diet.  Drink plenty of fluids but you should avoid alcoholic beverages for 24 hours.  ACTIVITY:  You should plan to take it easy for the rest of today and you should NOT DRIVE or use heavy machinery until tomorrow (because of the sedation medicines used during the test).    FOLLOW UP: Our staff will call the number listed on your records 48-72 hours following your procedure to check on you and address any questions or concerns that you may have regarding the information given to you following your procedure. If we do not reach you, we will leave a message.  We will attempt to reach you two times.  During this call, we will ask if you have developed any symptoms of COVID 19. If you develop any symptoms (ie: fever, flu-like symptoms, shortness of breath, cough etc.) before then, please call [phone].  If you test positive for Covid 19 in the 2 weeks post procedure, please call and report this information to [place].    If any biopsies were taken you will be contacted by phone or by letter within the next 1-3 weeks.  Please call us at [phone] if you have not heard about the biopsies in 3 weeks.    SIGNATURES/CONFIDENTIALITY: You and/or your care partner have signed paperwork which will be entered into your electronic medical record.  These signatures attest to the fact that that the information above on your After Visit Summary has been reviewed and is understood.  Full responsibility of the confidentiality of this discharge information lies with you and/or your care-partner.

## 2018-12-23 NOTE — Progress Notes (Signed)
The patient has been reevaluated. She was still having significant nausea and dry retching. Given 25 mg IV Benadryl without effect. At this point in time we have exhausted what we are able to give in our endoscopy suite. She is received 8 mg of Zofran and 25 mg of Phenergan and 25 mg of Benadryl all of these have been through IV. Her procedures were unremarkable in the sense that they were relatively straightforward without concerns for potential complications.  She received IV propofol for her sedation. As she continues to have issues she will need to be further evaluated as well as potentially observed for improvement in her symptoms. She has had some baseline issues with nausea but had not been requiring or taking any antiemetics at home. I will send a prescription for PR Compazine to the pharmacy and I will also send a prescription for sublingual Zofran so that she will have prescriptions at her pharmacy. However until she is able to not be significantly in distress with dry retching she will need to be observed. We discussed discharge to home versus continued evaluation in the emergency department and we agreed with the patient and her husband to move forward with going to the ED. It may be reasonable to consider imaging of her abdomen and chest with a chest x-ray and KUB to ensure that she does not have any other issues, however, she has a relatively benign abdomen and has been passing gas. She has had endocrinologic work-up including TSH as well as cortisol.  These were both normal.  However the cortisol was low normal at 6.4. May be reasonable to also consider electrolyte evaluation due to her significant symptoms to ensure that her symptoms are not a result of electrolyte disturbances.  I will relay to the inpatient GI team that this patient is going to the ED should she end up needing to be observed or admitted and if they need to evaluate her should she remain in the hospital longer than  for observation.  I will be away this evening until next week but if there are urgent issues the on-call [facility] MD will be able to try and help with those questions that may arise. If there are any findings or concerns for complications post her procedure please alert the GI service no matter what.  Thank you in advance to the emergency team for evaluating and trying to help this patient further.   Justice Britain, MD [facility] Office # [phone]

## 2018-12-23 NOTE — Op Note (Signed)
[place] Patient Name: Wanda Acosta Procedure Date: 12/23/2018 11:13 AM MRN: [id number] Endoscopist: Justice Britain , MD Age: 28 Referring MD:  Date of Birth: 05/05/1991 Gender: Female Account #: [id number] Procedure:                Upper GI endoscopy Indications:              Diarrhea Medicines:                Monitored Anesthesia Care Procedure:                Pre-Anesthesia Assessment:                           - Prior to the procedure, a History and Physical                            was performed, and patient medications and                            allergies were reviewed. The patient's tolerance of                            previous anesthesia was also reviewed. The risks                            and benefits of the procedure and the sedation                            options and risks were discussed with the patient.                            All questions were answered, and informed consent                            was obtained. Prior Anticoagulants: The patient has                            taken no previous anticoagulant or antiplatelet                            agents. ASA Grade Assessment: I - A normal, healthy                            patient. After reviewing the risks and benefits,                            the patient was deemed in satisfactory condition to                            undergo the procedure.                           After obtaining informed consent, the endoscope was  passed under direct vision. Throughout the                            procedure, the patient's blood pressure, pulse, and                            oxygen saturations were monitored continuously. The                            Model GIF-HQ190 [id number]) scope was introduced                            through the mouth, and advanced to the second part                            of duodenum. The upper GI endoscopy was              accomplished without difficulty. The patient                            tolerated the procedure. Scope In: Scope Out: Findings:                 No gross lesions were noted in the entire                            esophagus. Biopsies were taken with a cold forceps                            for histology to rule out EoE.                           The Z-line was irregular and was found 40 cm from                            the incisors.                           Multiple small pedunculated and sessile polyps were                            found in the gastric fundus and in the gastric body                            - consistent with fundic gland polyps.                            Biopsies/removal of a few of these were done for                            histology purposes.                           Normal mucosa was found in the entire examined  stomach otherwise. Biopsies were taken with a cold                            forceps for histology and Helicobacter pylori                            testing.                           Normal mucosa was found in the duodenal bulb, in                            the first portion of the duodenum and in the second                            portion of the duodenum. Biopsies were taken with a                            cold forceps for histology to rule out enteropathy. Complications:            No immediate complications. Estimated Blood Loss:     Estimated blood loss was minimal. Impression:               - No gross lesions in esophagus. Biopsied for EoE.                           - Z-line irregular, 40 cm from the incisors.                           - Multiple gastric polyps. Biopsied - likely fundic                            gland.                           - Otherwise, normal mucosa was found in the entire                            stomach. Biopsied for HP.                           - Normal mucosa was  found in the duodenal bulb, in                            the first portion of the duodenum and in the second                            portion of the duodenum. Biopsied. Recommendation:           - Proceed to scheduled colonoscopy.                           - May transition to once daily PPI.                           -  Await pathology results.                           - Observe patient's clinical course.                           - The findings and recommendations were discussed                            with the patient. Justice Britain, MD 12/23/2018 12:13:18 PM

## 2018-12-23 NOTE — Op Note (Signed)
[place] Patient Name: Wanda Acosta Procedure Date: 12/23/2018 11:12 AM MRN: [id number] Endoscopist: Justice Britain , MD Age: 28 Referring MD:  Date of Birth: 10-27-1990 Gender: Female Account #: [id number] Procedure:                Colonoscopy Indications:              Epigastric abdominal pain, Chronic diarrhea Medicines:                Monitored Anesthesia Care Procedure:                Pre-Anesthesia Assessment:                           - Prior to the procedure, a History and Physical                            was performed, and patient medications and                            allergies were reviewed. The patient's tolerance of                            previous anesthesia was also reviewed. The risks                            and benefits of the procedure and the sedation                            options and risks were discussed with the patient.                            All questions were answered, and informed consent                            was obtained. Prior Anticoagulants: The patient has                            taken no previous anticoagulant or antiplatelet                            agents. ASA Grade Assessment: I - A normal, healthy                            patient. After reviewing the risks and benefits,                            the patient was deemed in satisfactory condition to                            undergo the procedure.                           After obtaining informed consent, the colonoscope  was passed under direct vision. Throughout the                            procedure, the patient's blood pressure, pulse, and                            oxygen saturations were monitored continuously. The                            Colonoscope was introduced through the anus and                            advanced to the 10 cm into the ileum. The                            colonoscopy was performed without  difficulty. The                            patient tolerated the procedure. The quality of the                            bowel preparation was good. The terminal ileum,                            ileocecal valve, appendiceal orifice, and rectum                            were photographed. Scope In: 11:40:54 AM Scope Out: 11:58:04 AM Scope Withdrawal Time: 0 hours 13 minutes 57 seconds  Total Procedure Duration: 0 hours 17 minutes 10 seconds  Findings:                 The digital rectal exam findings include                            non-thrombosed external hemorrhoids. Pertinent                            negatives include no palpable rectal lesions.                           The terminal ileum and ileocecal valve appeared                            normal. Biopsies were taken with a cold forceps for                            histology.                           Two sessile polyps were found in the recto-sigmoid                            colon and descending colon. The polyps were 1 to 2  mm in size. These polyps were removed with a cold                            biopsy forceps. Resection and retrieval were                            complete.                           Normal mucosa was found in the entire colon                            otherwise. Biopsies for histology were taken with a                            cold forceps from the cecum, right colon, left                            colon and rectum for evaluation of microscopic                            colitis.                           Non-bleeding non-thrombosed external and internal                            hemorrhoids were found during retroflexion, during                            perianal exam and during digital exam. The                            hemorrhoids were Grade I (internal hemorrhoids that                            do not prolapse). Complications:            No immediate  complications. Estimated Blood Loss:     Estimated blood loss was minimal. Impression:               - Non-thrombosed external hemorrhoids found on                            digital rectal exam.                           - The examined portion of the ileum was normal.                            Biopsied.                           - Two 1 to 2 mm polyps at the recto-sigmoid colon  and in the descending colon, removed with a cold                            biopsy forceps. Resected and retrieved.                           - Normal mucosa in the entire examined colon                            otherwise. Biopsied.                           - Non-bleeding non-thrombosed external and internal                            hemorrhoids. Recommendation:           - The patient will be observed post-procedure,                            until all discharge criteria are met.                           - Discharge patient to home.                           - Patient has a contact number available for                            emergencies. The signs and symptoms of potential                            delayed complications were discussed with the                            patient. Return to normal activities tomorrow.                            Written discharge instructions were provided to the                            patient.                           - Resume previous diet.                           - Continue present medications.                           - If all biopsies return as normal without overt                            causation noted, her radiologic imaging has been                            completed, would  consider role of SIBO testing and                            EPI testing. 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 Will consider role of IBGard use  in                            future.                           - The findings and recommendations were discussed                            with the patient. Justice Britain, MD 12/23/2018 12:18:39 PM

## 2018-12-23 NOTE — Progress Notes (Signed)
Patient is feeling better after post Benadryl. Will monitor another 10-15 minutes and if OK then will plan to discharge, otherwise will send to the [facility] ED.  Justice Britain, MD [facility] Office # [phone]

## 2018-12-23 NOTE — Progress Notes (Signed)
Called to room to assist during endoscopic procedure.  Patient ID and intended procedure confirmed with present staff. Received instructions for my participation in the procedure from the performing physician.  

## 2018-12-27 ENCOUNTER — Telehealth: Payer: Self-pay | Admitting: *Deleted

## 2018-12-27 NOTE — Telephone Encounter (Signed)
First attempt, left VM.  

## 2018-12-27 NOTE — Telephone Encounter (Signed)
  Follow up Call-  Call back number 12/23/2018  Post procedure Call Back phone  # [phone]  Permission to leave phone message Yes     Patient questions:  Do you have a fever, pain , or abdominal swelling? No. Pain Score  0 *  Have you tolerated food without any problems? Yes.    Have you been able to return to your normal activities? Yes.    Do you have any questions about your discharge instructions: Diet   No. Medications  No. Follow up visit  No.  Do you have questions or concerns about your Care? No.  Actions: * If pain score is 4 or above: No action needed, pain <4.  1. Have you developed a fever since your procedure? no  2.   Have you had an respiratory symptoms (SOB or cough) since your procedure? no  3.   Have you tested positive for COVID 19 since your procedure no  4.   Have you had any family members/close contacts diagnosed with the COVID 19 since your procedure?  no   If yes to any of these questions please route to Joylene John, RN and Alphonsa Gin, Therapist, sports.

## 2018-12-31 ENCOUNTER — Encounter: Payer: Self-pay | Admitting: Gastroenterology

## 2018-12-31 ENCOUNTER — Telehealth: Payer: Self-pay

## 2018-12-31 DIAGNOSIS — R197 Diarrhea, unspecified: Secondary | ICD-10-CM

## 2018-12-31 DIAGNOSIS — R194 Change in bowel habit: Secondary | ICD-10-CM

## 2018-12-31 NOTE — Telephone Encounter (Signed)
-----   Message from Irving Copas., MD sent at 12/31/2018  1:49 PM EDT ----- Regarding: Follow-up Wanda Acosta,This patient needs the following labs to be performed at her convenience:Full celiac panel and IgA levelIgM/IgG/IgE levelAnti-enterocyte antibody evaluation (will be a send out most likely)Stool ova/parasite to evaluate for Giardia and CryptosporidiumFecal elastase to evaluate for exocrine pancreatic insufficiencyConsideration of bacterial overgrowth testing via [facility].I would go ahead and start with labs and stool studies and then we can talk about the breath testing and a follow-up clinic visit.Please set this up for the next 3 to 4 weeks.Thank you.GM

## 2019-01-03 NOTE — Telephone Encounter (Signed)
Patient calling in requesting results. [facility] # [phone]

## 2019-01-03 NOTE — Telephone Encounter (Signed)
I reviewed the results and recommendations with the patient.  She will come for labs at her convenience. Her follow up has been scheduled for for July

## 2019-01-07 ENCOUNTER — Other Ambulatory Visit (INDEPENDENT_AMBULATORY_CARE_PROVIDER_SITE_OTHER): Payer: Managed Care, Other (non HMO)

## 2019-01-07 DIAGNOSIS — R197 Diarrhea, unspecified: Secondary | ICD-10-CM

## 2019-01-07 DIAGNOSIS — R1033 Periumbilical pain: Secondary | ICD-10-CM | POA: Diagnosis not present

## 2019-01-07 DIAGNOSIS — R110 Nausea: Secondary | ICD-10-CM

## 2019-01-07 DIAGNOSIS — R194 Change in bowel habit: Secondary | ICD-10-CM

## 2019-01-07 LAB — HIGH SENSITIVITY CRP: CRP, High Sensitivity: 9.53 mg/L — ABNORMAL HIGH (ref 0.000–5.000)

## 2019-01-13 ENCOUNTER — Telehealth: Payer: Self-pay | Admitting: Gastroenterology

## 2019-01-13 NOTE — Telephone Encounter (Signed)
Tried to reach pt but mailbox was full.  Will wait for the pt to return call.

## 2019-01-13 NOTE — Telephone Encounter (Signed)
Patient called for lab results.

## 2019-01-14 NOTE — Telephone Encounter (Signed)
Left message on machine to call back  

## 2019-01-14 NOTE — Telephone Encounter (Signed)
The pt has been advised results have not been reviewed and she will be called when available

## 2019-01-18 ENCOUNTER — Telehealth: Payer: Self-pay | Admitting: Gastroenterology

## 2019-01-18 ENCOUNTER — Other Ambulatory Visit: Payer: Managed Care, Other (non HMO)

## 2019-01-18 DIAGNOSIS — R194 Change in bowel habit: Secondary | ICD-10-CM

## 2019-01-18 DIAGNOSIS — R197 Diarrhea, unspecified: Secondary | ICD-10-CM

## 2019-01-18 LAB — CELIAC DISEASE HLA DQ ASSOC.
DQ2 (DQA1 0501/0505,DQB1 02XX): NEGATIVE
DQ8 (DQA1 03XX, DQB1 0302): NEGATIVE

## 2019-01-18 NOTE — Telephone Encounter (Signed)
Tried to reach the pt and no answer or no voice mail

## 2019-01-18 NOTE — Telephone Encounter (Signed)
Pt called regarding results of labs for celiac disease.

## 2019-01-18 NOTE — Telephone Encounter (Signed)
I signed this encounter by mistake. Pls call pt.

## 2019-01-18 NOTE — Telephone Encounter (Signed)
The pt was given information via My Chart per Dr Rush Landmark.  She also sent in stool studies today.  We will call when results are available.

## 2019-01-20 ENCOUNTER — Encounter: Payer: Self-pay | Admitting: Family Medicine

## 2019-01-27 DIAGNOSIS — K8681 Exocrine pancreatic insufficiency: Secondary | ICD-10-CM | POA: Insufficient documentation

## 2019-01-27 LAB — GIARDIA/CRYPTOSPORIDIUM (EIA)
MICRO NUMBER:: 598145
MICRO NUMBER:: 598146
RESULT:: NOT DETECTED
RESULT:: NOT DETECTED
SPECIMEN QUALITY:: ADEQUATE
SPECIMEN QUALITY:: ADEQUATE

## 2019-01-27 LAB — OVA AND PARASITE EXAMINATION
CONCENTRATE RESULT:: NONE SEEN
MICRO NUMBER:: 598147
SPECIMEN QUALITY:: ADEQUATE
TRICHROME RESULT:: NONE SEEN

## 2019-01-27 LAB — PANCREATIC ELASTASE, FECAL: Pancreatic Elastase-1, Stool: 62 mcg/g — ABNORMAL LOW

## 2019-01-31 ENCOUNTER — Telehealth: Payer: Self-pay | Admitting: Gastroenterology

## 2019-01-31 NOTE — Telephone Encounter (Signed)
Returned pt's call. Gave her the option to come in the office, if that's what she preferred. She states that she will do virtual visit and if Dr. Rush Landmark needs to see her office then we can arrange that for the follow-up in office visit.

## 2019-01-31 NOTE — Telephone Encounter (Signed)
Pt would like to know if appt tomorrow is virtual or in office.

## 2019-02-01 ENCOUNTER — Ambulatory Visit (INDEPENDENT_AMBULATORY_CARE_PROVIDER_SITE_OTHER): Payer: Managed Care, Other (non HMO) | Admitting: Gastroenterology

## 2019-02-01 ENCOUNTER — Other Ambulatory Visit: Payer: Self-pay

## 2019-02-01 DIAGNOSIS — K649 Unspecified hemorrhoids: Secondary | ICD-10-CM

## 2019-02-01 DIAGNOSIS — R1084 Generalized abdominal pain: Secondary | ICD-10-CM | POA: Diagnosis not present

## 2019-02-01 DIAGNOSIS — K8681 Exocrine pancreatic insufficiency: Secondary | ICD-10-CM | POA: Diagnosis not present

## 2019-02-01 DIAGNOSIS — K529 Noninfective gastroenteritis and colitis, unspecified: Secondary | ICD-10-CM

## 2019-02-01 DIAGNOSIS — K625 Hemorrhage of anus and rectum: Secondary | ICD-10-CM | POA: Diagnosis not present

## 2019-02-01 MED ORDER — PANCRELIPASE (LIP-PROT-AMYL) 36000-114000 UNITS PO CPEP: ORAL_CAPSULE | ORAL | 2 refills | Status: DC

## 2019-02-01 NOTE — Progress Notes (Signed)
GASTROENTEROLOGY TELEMEDICINE OUTPATIENT CLINIC VISIT   Primary Care Provider Shelda Pal, [place] [address] [place] [address] [place] [address] [phone]  Patient Profile: Wanda Acosta is a 28 y.o. female with a pmh significant for GERD, Obesity, possible EPI, hemorrhoids, chronic diarrhea.  The patient presents to the [facility] for an evaluation and management of problem(s) noted below:  Problem List 1. Exocrine pancreatic insufficiency   2. Chronic diarrhea   3. Generalized abdominal pain   4. Rectal bleeding   5. Hemorrhoids, unspecified hemorrhoid type     I connected with Arta Bruce on 02/01/19. Due to the COVID-19 Pandemic, this service was provided via telemedicine using Audio/Visual Media. The patient was located at home. The provider was located in the office. The patient did consent to this visit and is aware of charges through their insurance as well as the limitations of evaluation and management by telemedicine. Other persons participating in this telemedicine service were none. Time spent on visit and coordination of care was greater than 25 minutes.   History of Present Illness: Please see initial consultation note and progress notes for full details of HPI.  Interval History This was a scheduled follow-up with patient post her endoscopy/colonoscopy.  She recounts that her stools remain 60 to 70% of the time as very loose.  She is also noting since the procedure some small amounts of blood In stool as well as having to wake up and overnight to have a bowel movement.  These are symptoms that were not previously present.  She has begun to use loperamide on an as-needed basis 2 mg here and there.  She feels her weight has continued to decrease and she is down to 208 pounds.  Patient remains significantly concerned because of the weight loss although BMI remains study.  She denies any melena.  Abdominal discomfort persists and is  generalized throughout her abdomen.  Cramping at times.  Bloating at times as well.  Nausea has persisted.  GI Review of Systems Positive as above Negative for early satiety, vomiting  Review of Systems General: Denies fevers/chills Cardiovascular: Denies chest pain Pulmonary: Denies shortness of breath Gastroenterological: See HPI Genitourinary: Denies darkened urine Hematological: Denies easy bruising Dermatological: Denies jaundice Psychological: Mood remains anxious   Medications Current Outpatient Medications  Medication Sig Dispense Refill   escitalopram (LEXAPRO) 10 MG tablet Take 1 tablet (10 mg total) by mouth daily. 30 tablet 3   lipase/protease/amylase (CREON) 36000 UNITS CPEP capsule Take 1- 36,000 mg capsules with each snack daily. Take 2- 36,000mg  capsules( total of 72,000) with each meal daily. 270 capsule 2   ondansetron (ZOFRAN ODT) 8 MG disintegrating tablet Take 1 tablet (8 mg total) by mouth every 8 (eight) hours as needed for nausea, vomiting or refractory nausea / vomiting. 30 tablet 1   pantoprazole (PROTONIX) 40 MG tablet Take 1 tablet (40 mg total) by mouth 2 (two) times daily. Take 1 tab twice daily for 7 days and then 1 tab daily. 180 tablet 3   prochlorperazine (COMPAZINE) 25 MG suppository Place 1 suppository (25 mg total) rectally every 12 (twelve) hours as needed for nausea or vomiting. 15 suppository 1   traZODone (DESYREL) 50 MG tablet Take 0.5-1 tablets (25-50 mg total) by mouth at bedtime as needed for sleep. 30 tablet 3   Current Facility-Administered Medications  Medication Dose Route Frequency Provider Last Rate Last Dose   0.9 %  sodium chloride infusion  500 mL Intravenous Once  Mansouraty, Telford Nab., MD       promethazine [facility]) injection 25 mg  25 mg Intravenous Once Mansouraty, Telford Nab., MD        Allergies No Known Allergies  Histories Past Medical History:  Diagnosis Date   Anxiety 12/10/2018   on lexapro   GERD  (gastroesophageal reflux disease)    No known health problems    Obesity    Past Surgical History:  Procedure Laterality Date   OTHER SURGICAL HISTORY     Finger reattachment   Social History   Socioeconomic History   Marital status: Married    Spouse name: Tory   Number of children: 1   Years of education: Not on file   Highest education level: Not on file  Occupational History   Not on file  Social Needs   Financial resource strain: Not on file   Food insecurity    Worry: Not on file    Inability: Not on file   Transportation needs    Medical: Not on file    Non-medical: Not on file  Tobacco Use   Smoking status: Never Smoker   Smokeless tobacco: Never Used  Substance and Sexual Activity   Alcohol use: Never    Frequency: Never   Drug use: Never   Sexual activity: Yes    Partners: Male  Lifestyle   Physical activity    Days per week: Not on file    Minutes per session: Not on file   Stress: Not on file  Relationships   Social connections    Talks on phone: Not on file    Gets together: Not on file    Attends religious service: Not on file    Active member of club or organization: Not on file    Attends meetings of clubs or organizations: Not on file    Relationship status: Not on file   Intimate partner violence    Fear of current or ex partner: Not on file    Emotionally abused: Not on file    Physically abused: Not on file    Forced sexual activity: Not on file  Other Topics Concern   Not on file  Social History Narrative   Not on file   Family History  Problem Relation Age of Onset   Diabetes Maternal Grandfather    Colon cancer Paternal Grandfather    Esophageal cancer Neg Hx    Inflammatory bowel disease Neg Hx    Liver disease Neg Hx    Pancreatic cancer Neg Hx    Stomach cancer Neg Hx    I have reviewed her medical, social, and family history in detail and updated the electronic medical record as necessary.     PHYSICAL EXAMINATION  Telemedicine visit The patient continues to have issues in regards to discomfort in the periumbilical region   REVIEW OF DATA  I reviewed the following data at the time of this encounter:  GI Procedures and Studies  May 2020 EGD - No gross lesions in esophagus. Biopsied for EoE. - Z-line irregular, 40 cm from the incisors. - Multiple gastric polyps. Biopsied - likely fundic gland. - Otherwise, normal mucosa was found in the entire stomach. Biopsied for HP. - Normal mucosa was found in the duodenal bulb, in the first portion of the duodenum and in the second portion of the duodenum. Biopsied.  May 2020 colonoscopy - Non-thrombosed external hemorrhoids found on digital rectal exam. - The examined portion of the ileum was normal.  Biopsied. - Two 1 to 2 mm polyps at the recto-sigmoid colon and in the descending colon, removed with a cold biopsy forceps. Resected and retrieved. - Normal mucosa in the entire examined colon otherwise. Biopsied. - Non-bleeding non-thrombosed external and internal hemorrhoids. Diagnosis 1. Surgical [P], duodenal - BENIGN DUODENAL MUCOSA WITH FOCALLY INCREASED INTRAEPITHELIAL LYMPHOCYTES - NO ACUTE INFLAMMATION OR VILLOUS BLUNTING IDENTIFIED 2. Surgical [P], random gastric - REACTIVE GASTROPATHY WITH FEATURES CONSISTENT WITH PROTON PUMP INHIBITOR EFFECT - NO H. PYLORI OR INTESTINAL METAPLASIA IDENTIFIED - SEE COMMENT 3. Surgical [P], gastric polyp sampling - FUNDIC GLAND POLYP(S) - NO H. PYLORI, INTESTINAL METAPLASIA OR MALIGNANCY IDENTIFIED 4. Surgical [P], random sites - esophageal - BENIGN SQUAMOUS MUCOSA - NO INCREASED INTRAEPITHELIAL EOSINOPHILS 5. Surgical [P], small bowel, terminal ileum - SMALL BOWEL MUCOSA WITH LYMPHOID AGGREGATES - NO ACUTE INFLAMMATION, GRANULOMAS OR MALIGNANCY IDENTIFIED 6. Surgical [P], random sites - BENIGN COLONIC MUCOSA - NO ACTIVE INFLAMMATION OR EVIDENCE OF MICROSCOPIC COLITIS - NO HIGH  GRADE DYSPLASIA OR MALIGNANCY IDENTIFIED 7. Surgical [P], colon, descending, rectosigmoid, polyp (2) - HYPERPLASTIC POLYP (2 OF 3 FRAGMENTS) - BENIGN COLONIC MUCOSA (1 OF 3 FRAGMENTS) - NO HIGH GRADE DYSPLASIA OR MALIGNANCY IDENTIFIED  Laboratory Studies  Reviewed in epic  Imaging Studies  No new imaging studies to review   ASSESSMENT  Ms. Petraglia is a 28 y.o. female with a pmh significant for GERD, Obesity, possible EPI, hemorrhoids, chronic diarrhea.  The patient is seen today for evaluation and management of:  1. Exocrine pancreatic insufficiency   2. Chronic diarrhea   3. Generalized abdominal pain   4. Rectal bleeding   5. Hemorrhoids, unspecified hemorrhoid type    The patient's manifestation of GI symptoms remains undiagnosed.  We did find her to have a low fecal elastase on testing and it is entirely possible that some of her symptoms could be due to EPI.  She has had a cross-sectional CT abdomen which has not shown any evidence of pancreatic mass or lesion.  Many cases of EPI remain idiopathic in nature.  After long discussion we decided to move forward with a trial of pancreatic enzyme replacement therapy.  We will use Creon.  We have been able to access samples of Creon and she will begin taking 1 tablet of 36,000 units of lipase with each meal and 1 with each snack.  My prescription for her however will be 72,000 units with each meal and 36,000 units with each snack during the day.  We will ask for patient assistance if the cost is financially prohibitive for her.  I like to see how she does with a couple months of therapy overall.  We will re-prescribe her Zofran as needed.  We will recheck her CRP.  She stopped using Bentyl since she did not feel that it was effective we may consider Levsin in the future.  We will need to consider IBgard for her in the future as well.  SIBO breath testing may be considered as well for her in the future.  All patient questions were answered, to the  best of my ability, and the patient agrees to the aforementioned plan of action with follow-up as indicated.   PLAN  Continue PPI daily Antiemetics prescribed for patient as needed Will DC Bentyl since she is not finding effectiveness but consider Levsin or IBgard in future We will initiate Creon 72,000 units with each meal 36,000 units with each snack We will consider SIBO breath testing in future Laboratories as  outlined below   Orders Placed This Encounter  Procedures   CBC   CRP High sensitivity    New Prescriptions   LIPASE/PROTEASE/AMYLASE (CREON) 36000 UNITS CPEP CAPSULE    Take 1- 36,000 mg capsules with each snack daily. Take 2- 36,000mg  capsules( total of 72,000) with each meal daily.   Modified Medications   No medications on file    Planned Follow Up: Return in about 6 weeks (around 03/15/2019).   Justice Britain, MD [facility] Office # [phone]

## 2019-02-01 NOTE — Patient Instructions (Addendum)
Your provider has requested that you go to the basement level for lab work before leaving today. Press "B" on the elevator. The lab is located at the first door on the left as you exit the elevator.   We have sent the following medications to your pharmacy for you to pick up at your convenience: Creon   Thank you for choosing me and [facility].  Dr. Rush Landmark

## 2019-02-03 DIAGNOSIS — R1084 Generalized abdominal pain: Secondary | ICD-10-CM | POA: Insufficient documentation

## 2019-02-03 DIAGNOSIS — K529 Noninfective gastroenteritis and colitis, unspecified: Secondary | ICD-10-CM | POA: Insufficient documentation

## 2019-02-03 DIAGNOSIS — K625 Hemorrhage of anus and rectum: Secondary | ICD-10-CM | POA: Insufficient documentation

## 2019-02-03 DIAGNOSIS — K649 Unspecified hemorrhoids: Secondary | ICD-10-CM | POA: Insufficient documentation

## 2019-02-04 ENCOUNTER — Encounter: Payer: Self-pay | Admitting: Gastroenterology

## 2019-02-08 ENCOUNTER — Other Ambulatory Visit (INDEPENDENT_AMBULATORY_CARE_PROVIDER_SITE_OTHER): Payer: Managed Care, Other (non HMO)

## 2019-02-08 DIAGNOSIS — K529 Noninfective gastroenteritis and colitis, unspecified: Secondary | ICD-10-CM

## 2019-02-08 DIAGNOSIS — K625 Hemorrhage of anus and rectum: Secondary | ICD-10-CM

## 2019-02-08 DIAGNOSIS — K8681 Exocrine pancreatic insufficiency: Secondary | ICD-10-CM | POA: Diagnosis not present

## 2019-02-08 DIAGNOSIS — K649 Unspecified hemorrhoids: Secondary | ICD-10-CM

## 2019-02-08 LAB — CBC
HCT: 38.8 % (ref 36.0–46.0)
Hemoglobin: 12.9 g/dL (ref 12.0–15.0)
MCHC: 33.3 g/dL (ref 30.0–36.0)
MCV: 81 fl (ref 78.0–100.0)
Platelets: 267 10*3/uL (ref 150.0–400.0)
RBC: 4.79 Mil/uL (ref 3.87–5.11)
RDW: 12.8 % (ref 11.5–15.5)
WBC: 8.1 10*3/uL (ref 4.0–10.5)

## 2019-02-08 LAB — HIGH SENSITIVITY CRP: CRP, High Sensitivity: 11.24 mg/L — ABNORMAL HIGH (ref 0.000–5.000)

## 2019-02-09 ENCOUNTER — Other Ambulatory Visit: Payer: Self-pay

## 2019-02-09 DIAGNOSIS — K8681 Exocrine pancreatic insufficiency: Secondary | ICD-10-CM

## 2019-03-10 ENCOUNTER — Other Ambulatory Visit (INDEPENDENT_AMBULATORY_CARE_PROVIDER_SITE_OTHER): Payer: Managed Care, Other (non HMO)

## 2019-03-10 ENCOUNTER — Ambulatory Visit (INDEPENDENT_AMBULATORY_CARE_PROVIDER_SITE_OTHER): Payer: Managed Care, Other (non HMO) | Admitting: Gastroenterology

## 2019-03-10 ENCOUNTER — Other Ambulatory Visit: Payer: Managed Care, Other (non HMO)

## 2019-03-10 ENCOUNTER — Encounter: Payer: Self-pay | Admitting: Gastroenterology

## 2019-03-10 VITALS — BP 104/80 | HR 76 | Temp 97.6°F | Ht 68.0 in | Wt 210.0 lb

## 2019-03-10 DIAGNOSIS — R5383 Other fatigue: Secondary | ICD-10-CM

## 2019-03-10 DIAGNOSIS — K8681 Exocrine pancreatic insufficiency: Secondary | ICD-10-CM

## 2019-03-10 DIAGNOSIS — R1033 Periumbilical pain: Secondary | ICD-10-CM | POA: Diagnosis not present

## 2019-03-10 DIAGNOSIS — R197 Diarrhea, unspecified: Secondary | ICD-10-CM

## 2019-03-10 DIAGNOSIS — R109 Unspecified abdominal pain: Secondary | ICD-10-CM | POA: Diagnosis not present

## 2019-03-10 LAB — CBC
HCT: 40.8 % (ref 36.0–46.0)
Hemoglobin: 13.6 g/dL (ref 12.0–15.0)
MCHC: 33.2 g/dL (ref 30.0–36.0)
MCV: 80.9 fl (ref 78.0–100.0)
Platelets: 298 10*3/uL (ref 150.0–400.0)
RBC: 5.05 Mil/uL (ref 3.87–5.11)
RDW: 13.1 % (ref 11.5–15.5)
WBC: 8.5 10*3/uL (ref 4.0–10.5)

## 2019-03-10 LAB — VITAMIN B12: Vitamin B-12: 438 pg/mL (ref 211–911)

## 2019-03-10 LAB — HIGH SENSITIVITY CRP: CRP, High Sensitivity: 9.57 mg/L — ABNORMAL HIGH (ref 0.000–5.000)

## 2019-03-10 MED ORDER — PANTOPRAZOLE SODIUM 40 MG PO TBEC: 40.0000 mg | DELAYED_RELEASE_TABLET | Freq: Every day | ORAL | 1 refills | Status: DC

## 2019-03-10 MED ORDER — PANCRELIPASE (LIP-PROT-AMYL) 36000-114000 UNITS PO CPEP: ORAL_CAPSULE | ORAL | 4 refills | Status: DC

## 2019-03-10 NOTE — Progress Notes (Signed)
[place] VISIT   Primary Care Provider Shelda Pal, [place] [address] [place] [address] [place] [address] [phone]  Patient Profile: Wanda Acosta is a 28 y.o. female with a pmh significant for anxiety, GERD, Obesity, possible EPI (on PERT), hemorrhoids, chronic diarrhea (likely functional workup ongoing).  The patient presents to the [facility] for an evaluation and management of problem(s) noted below:  Problem List 1. Exocrine pancreatic insufficiency   2. Diarrhea, unspecified type   3. Abdominal cramping   4. Periumbilical abdominal pain   5. Other fatigue      History of Present Illness: Please see initial consultation note and progress notes for full details of HPI.  Interval History This is a scheduled follow-up for the patient.  She states that since the initiation of Creon she is approximately 40% better but is still having issues.  She will have a day where she has no bowel movement or a small formed bowel movement and then the next day she may have diarrhea multiple times.  She will wake up at night to have bowel movements between 3 and 4 AM.  If she wakes up at night to have a bowel movement she will end up taking some Imodium.  Over the course of that day she may take 4 to 6 mg total and then she will do well until 2 days later when she may have another bowel movement of loose stool/diarrhea.  Weight has stabilized since our last visit on telehealth.  She is no longer having any rectal bleeding either.  Her Creon is at 72,000 units per meal and 1 with each snack if she remembers with the snacks.  Stress levels at home and work are stable as it is planned for her to continue working remotely at this time.  She is still having cramping which to her is often the biggest issue at the time that she is going to have her bowel movement.  She feels the cramps are labor pains as she has had previously.  She denies any nausea  or vomiting.  She feels that her current dose of Lexapro is helping her from a mental health perspective.  No significant nonsteroidals are being used currently.  1 of the big issues she is still suffering with is significant fatigue and decreased energy even though she is getting a good night sleep and she is no longer anemic.  GI Review of Systems Positive as above Negative for melena, hematochezia, pyrosis, odynophagia  Review of Systems General: Denies fevers/chills Cardiovascular: Denies chest pain Pulmonary: Denies shortness of breath Gastroenterological: See HPI Genitourinary: Denies darkened urine Hematological: Denies easy bruising Dermatological: Denies jaundice Psychological: Mood is stable (though she hopes things will continue to improve from a GI perspective so that her mood will improve even further)   Medications Current Outpatient Medications  Medication Sig Dispense Refill   escitalopram (LEXAPRO) 10 MG tablet Take 1 tablet (10 mg total) by mouth daily. 30 tablet 3   lipase/protease/amylase (CREON) 36000 UNITS CPEP capsule Take 4 capsules with each meal. Take 1 to 2 capsules with each snack. 340 capsule 4   ondansetron (ZOFRAN ODT) 8 MG disintegrating tablet Take 1 tablet (8 mg total) by mouth every 8 (eight) hours as needed for nausea, vomiting or refractory nausea / vomiting. 30 tablet 1   pantoprazole (PROTONIX) 40 MG tablet Take 1 tablet (40 mg total) by mouth daily. 90 tablet 1   traZODone (DESYREL) 50 MG  tablet Take 0.5-1 tablets (25-50 mg total) by mouth at bedtime as needed for sleep. 30 tablet 3   Current Facility-Administered Medications  Medication Dose Route Frequency Provider Last Rate Last Dose   0.9 %  sodium chloride infusion  500 mL Intravenous Once Mansouraty, Telford Nab., MD       promethazine (PHENERGAN) injection 25 mg  25 mg Intravenous Once Mansouraty, Telford Nab., MD        Allergies No Known Allergies  Histories Past Medical  History:  Diagnosis Date   Anxiety 12/10/2018   on lexapro   GERD (gastroesophageal reflux disease)    No known health problems    Obesity    Past Surgical History:  Procedure Laterality Date   OTHER SURGICAL HISTORY     Finger reattachment   Social History   Socioeconomic History   Marital status: Married    Spouse name: Tory   Number of children: 1   Years of education: Not on file   Highest education level: Not on file  Occupational History   Not on file  Social Needs   Financial resource strain: Not on file   Food insecurity    Worry: Not on file    Inability: Not on file   Transportation needs    Medical: Not on file    Non-medical: Not on file  Tobacco Use   Smoking status: Never Smoker   Smokeless tobacco: Never Used  Substance and Sexual Activity   Alcohol use: Never    Frequency: Never   Drug use: Never   Sexual activity: Yes    Partners: Male  Lifestyle   Physical activity    Days per week: Not on file    Minutes per session: Not on file   Stress: Not on file  Relationships   Social connections    Talks on phone: Not on file    Gets together: Not on file    Attends religious service: Not on file    Active member of club or organization: Not on file    Attends meetings of clubs or organizations: Not on file    Relationship status: Not on file   Intimate partner violence    Fear of current or ex partner: Not on file    Emotionally abused: Not on file    Physically abused: Not on file    Forced sexual activity: Not on file  Other Topics Concern   Not on file  Social History Narrative   Not on file   Family History  Problem Relation Age of Onset   Diabetes Maternal Grandfather    Colon cancer Paternal Grandfather    Esophageal cancer Neg Hx    Inflammatory bowel disease Neg Hx    Liver disease Neg Hx    Pancreatic cancer Neg Hx    Stomach cancer Neg Hx    I have reviewed her medical, social, and family  history in detail and updated the electronic medical record as necessary.    PHYSICAL EXAMINATION  BP 104/80 (BP Location: Left Arm, Patient Position: Sitting, Cuff Size: Normal)    Pulse 76    Temp 97.6 F (36.4 C)    Ht 5\' 8"  (1.727 m) Comment: height measured without shoes   Wt 210 lb (95.3 kg)    LMP 11/08/2018    BMI 31.93 kg/m  GEN: NAD, appears stated age, doesn't appear chronically ill PSYCH: Cooperative, without pressured speech EYE: Conjunctivae pink, sclerae anicteric ENT: MMM CV: RR without R/Gs  RESP: CTAB posteriorly, without adventitious sounds present GI: NABS, soft, mild tenderness to palpation in mid abdomen, nondistended, obese, without rebound or guarding  MSK/EXT: No lower extremity edema SKIN: No jaundice NEURO:  Alert & Oriented x 3, no focal deficits   REVIEW OF DATA  I reviewed the following data at the time of this encounter:  GI Procedures and Studies  Previously reviewed  Laboratory Studies  Reviewed in epic  Imaging Studies  No new imaging studies to review   ASSESSMENT  Ms. Seay is a 28 y.o. female with a pmh significant for anxiety, GERD, Obesity, possible EPI (on PERT), hemorrhoids, chronic diarrhea (likely functional workup ongoing).  The patient is seen today for evaluation and management of:  1. Exocrine pancreatic insufficiency   2. Diarrhea, unspecified type   3. Abdominal cramping   4. Periumbilical abdominal pain   5. Other fatigue    The patient is hemodynamically stable.  The etiology of her symptoms may be partially due to exocrine pancreas insufficiency however she may well have an underlying functional diarrhea or IBS.  With that being said we have not ruled out SIBO and will proceed with ordering breath testing to evaluate for bacterial overgrowth which can overlap with PPI at times.  Not a typical person that I would think that would have SIBO but it is worthwhile for [place] to exonerate.  I will increase her EPI therapy to a closer  weight-based dosing we will go up to 4 pills with each meal and 1-2 with each snack.  Going up much further may increase risk of complications we will have to see how she does with this.  We previously were using Bentyl but this did not help with her cramping we may need to consider Levsin in the future.  I may also think about IBgard as well for her.  Going to decrease her PPI to once daily dosing at this time to minimize her medicines.  We will continue her antiemetics.  We will get some laboratories just to evaluate for fatigue even though she more recently had been anemic we will check her B12 and blood counts again.  Zofran becomes ineffective we will consider another antiemetic.  May consider cholestyramine in the future though she still has her gallbladder as another adjunct for her diarrhea if necessary.  We discussed this small consideration of a 72-hour fecal fat and stool awesome calculation but we are holding on that for now.  We talked about and reviewed her imaging studies due to the possibility of having PPI as to whether there is something in the pancreas we could be missing but she had a recent cross-sectional CT abdomen/pelvis with IV contrast which did not show any evidence of pancreas issues.  In the future could consider pancreatic MRI to ensure no other issues are present.  Endoscopic ultrasound can be kept in mind as well for the future.  But see how she does with the increased PERT.  All patient questions were answered, to the best of my ability, and the patient agrees to the aforementioned plan of action with follow-up as indicated.   PLAN  PPI once daily Antiemetics PRN Consider Levsin in future Creon 4 pills with each meal and 1 to 2 pills with each snack (36,000 unit pills) Set up for SIBO breath testing Consider cholestyramine in future Antidiarrheals to be decreased as we see Creon usage increase to see if helpful or not Laboratories as outlined below to evaluate for  fatigue  Holding on any repeat imaging of the pancreas for now    Orders Placed This Encounter  Procedures   CBC   B12    New Prescriptions   No medications on file   Modified Medications   Modified Medication Previous Medication   LIPASE/PROTEASE/AMYLASE (CREON) 36000 UNITS CPEP CAPSULE lipase/protease/amylase (CREON) 36000 UNITS CPEP capsule      Take 4 capsules with each meal. Take 1 to 2 capsules with each snack.    Take 1- 36,000 mg capsules with each snack daily. Take 2- 36,000mg  capsules( total of 72,000) with each meal daily.   PANTOPRAZOLE (PROTONIX) 40 MG TABLET pantoprazole (PROTONIX) 40 MG tablet      Take 1 tablet (40 mg total) by mouth daily.    Take 1 tablet (40 mg total) by mouth 2 (two) times daily. Take 1 tab twice daily for 7 days and then 1 tab daily.    Planned Follow Up: No follow-ups on file.   Justice Britain, MD [facility] Office # [phone]

## 2019-03-10 NOTE — Patient Instructions (Addendum)
We have sent the following medications to your pharmacy for you to pick up at your convenience: Pantoprazole and Creon   Your provider has requested that you go to the basement level for lab work before leaving today. Press "B" on the elevator. The lab is located at the first door on the left as you exit the elevator.  You have been given a testing kit to check for small intestine bacterial overgrowth (SIBO) which is completed by a company named [facility]. Make sure to return your test in the mail using the return mailing label given you along with the kit. Your demographic and insurance information have already been sent to the company and they should be in contact with you over the next week regarding this test. Please keep in mind that you will be getting a call from phone number [phone] or a similar number. If you do not hear from them within this time frame, please call our office at [phone].   Thank you for choosing me and [facility].  Dr. Rush Landmark

## 2019-03-12 ENCOUNTER — Encounter: Payer: Self-pay | Admitting: Gastroenterology

## 2019-03-12 DIAGNOSIS — R197 Diarrhea, unspecified: Secondary | ICD-10-CM | POA: Insufficient documentation

## 2019-03-12 DIAGNOSIS — R109 Unspecified abdominal pain: Secondary | ICD-10-CM | POA: Insufficient documentation

## 2019-03-12 DIAGNOSIS — R5383 Other fatigue: Secondary | ICD-10-CM | POA: Insufficient documentation

## 2019-04-13 ENCOUNTER — Encounter: Payer: Self-pay | Admitting: Obstetrics and Gynecology

## 2019-04-20 ENCOUNTER — Other Ambulatory Visit: Payer: Self-pay | Admitting: Family Medicine

## 2019-04-20 DIAGNOSIS — F418 Other specified anxiety disorders: Secondary | ICD-10-CM

## 2019-04-20 MED ORDER — ESCITALOPRAM OXALATE 10 MG PO TABS: 10.0000 mg | ORAL_TABLET | Freq: Every day | ORAL | 3 refills | Status: DC

## 2019-04-26 ENCOUNTER — Encounter: Payer: Self-pay | Admitting: Obstetrics and Gynecology

## 2019-04-26 ENCOUNTER — Other Ambulatory Visit (HOSPITAL_COMMUNITY)
Admission: RE | Admit: 2019-04-26 | Discharge: 2019-04-26 | Disposition: A | Discharge status: Home / Self Care | Payer: Managed Care, Other (non HMO) | Source: Ambulatory Visit | Attending: Obstetrics and Gynecology | Admitting: Obstetrics and Gynecology

## 2019-04-26 ENCOUNTER — Ambulatory Visit (INDEPENDENT_AMBULATORY_CARE_PROVIDER_SITE_OTHER): Payer: Managed Care, Other (non HMO) | Admitting: Obstetrics and Gynecology

## 2019-04-26 ENCOUNTER — Other Ambulatory Visit: Payer: Self-pay

## 2019-04-26 VITALS — BP 108/70 | HR 80 | Temp 98.3°F | Resp 12 | Ht 68.5 in | Wt 202.0 lb

## 2019-04-26 DIAGNOSIS — Z01419 Encounter for gynecological examination (general) (routine) without abnormal findings: Secondary | ICD-10-CM | POA: Insufficient documentation

## 2019-04-26 DIAGNOSIS — N915 Oligomenorrhea, unspecified: Secondary | ICD-10-CM

## 2019-04-26 DIAGNOSIS — Z3009 Encounter for other general counseling and advice on contraception: Secondary | ICD-10-CM | POA: Diagnosis not present

## 2019-04-26 MED ORDER — NORGESTIM-ETH ESTRAD TRIPHASIC 0.18/0.215/0.25 MG-35 MCG PO TABS: 1.0000 | ORAL_TABLET | Freq: Every day | ORAL | 3 refills | Status: DC

## 2019-04-26 NOTE — Patient Instructions (Signed)

## 2019-04-26 NOTE — Progress Notes (Signed)
28 y.o. G38P1 Married Caucasian female here for annual exam.    Patient's cycles have really been irregular since she has been doing IVF for a surrogate family. She did 3 cycles of IVF in 2019 and then one in Feb, 2020. She stopped this in March.  She was monitored here by [facility], but the main clinic was in [place].  She states her health changed right after doing this.  She will not be continuing with surrogacy care.   No menses from January, 2020 until just now.  She was having cycles prior to doing IVF.  She used Nexplanon for 3 years during her tx.  She had mostly normal cycles prior to Nexplanon.   Using condoms for pregnancy prevention.  She really likes Nexplanon.   Had a lot of GI issues recently and dx with pancreatic insufficiency.   Used Trisprintec in the past.  Denies HTN, migraine with aura, liver or breast disease, personal or FH of thromboembolic events.   70 yo daughter.  Working full time from home.   PCP:  Riki Sheer, DO  Patient's last menstrual period was 04/21/2019 (exact date).     Period Duration (Days): 6-7 days Period Pattern: (!) Irregular Menstrual Flow: Heavy Menstrual Control: Maxi pad(Flex disc) Menstrual Control Change Freq (Hours): every 2 hours on heaviest day Dysmenorrhea: (!) Moderate(1 day severe cramps) Dysmenorrhea Symptoms: Cramping, Diarrhea     Sexually active: Yes.    The current method of family planning is condoms everytime.    Exercising: No.  The patient does not participate in regular exercise at present. Smoker:  no  Health Maintenance: Pap: 2019 normal per patient History of abnormal Pap:  no MMG:  n/a Colonoscopy:  11/2018 benign polyp;next 10 years BMD:   n/a  Result  n/a TDaP:  Up to date per patient Gardasil:   Yes, patient did 1--STATES HAD REACTION  HIV:Neg with pregnancy Hep C:Neg during surrogacy Screening Labs:   ----    reports that she has never smoked. She has never used  smokeless tobacco. She reports that she does not drink alcohol or use drugs.  Past Medical History:  Diagnosis Date  . Anxiety 12/10/2018   on lexapro  . Dysmenorrhea   . Exocrine pancreatic insufficiency 2020  . GERD (gastroesophageal reflux disease)   . No known health problems   . Obesity     Past Surgical History:  Procedure Laterality Date  . OTHER SURGICAL HISTORY     Finger reattachment    Current Outpatient Medications  Medication Sig Dispense Refill  . escitalopram (LEXAPRO) 10 MG tablet Take 1 tablet (10 mg total) by mouth daily. 30 tablet 3  . levocetirizine (XYZAL) 5 MG tablet Take 5 mg by mouth as needed for allergies.    Marland Kitchen lipase/protease/amylase (CREON) 36000 UNITS CPEP capsule Take 4 capsules with each meal. Take 1 to 2 capsules with each snack. 340 capsule 4  . ondansetron (ZOFRAN ODT) 8 MG disintegrating tablet Take 1 tablet (8 mg total) by mouth every 8 (eight) hours as needed for nausea, vomiting or refractory nausea / vomiting. 30 tablet 1  . pantoprazole (PROTONIX) 40 MG tablet Take 1 tablet (40 mg total) by mouth daily. 90 tablet 1  . traZODone (DESYREL) 50 MG tablet Take 0.5-1 tablets (25-50 mg total) by mouth at bedtime as needed for sleep. 30 tablet 3   No current facility-administered medications for this visit.     Family History  Problem Relation Age of Onset  .  Hypertension Father   . Diabetes Maternal Grandfather   . Colon cancer Paternal Grandfather 41  . Esophageal cancer Neg Hx   . Inflammatory bowel disease Neg Hx   . Liver disease Neg Hx   . Pancreatic cancer Neg Hx   . Stomach cancer Neg Hx     Review of Systems  All other systems reviewed and are negative.   Exam:   BP 108/70 (Cuff Size: Large)   Pulse 80   Temp 98.3 F (36.8 C) (Temporal)   Resp 12   Ht 5' 8.5" (1.74 m)   Wt 202 lb (91.6 kg)   LMP 04/21/2019 (Exact Date)   BMI 30.27 kg/m     General appearance: alert, cooperative and appears stated age Head:  normocephalic, without obvious abnormality, atraumatic Neck: no adenopathy, supple, symmetrical, trachea midline and thyroid normal to inspection and palpation Lungs: clear to auscultation bilaterally Breasts: normal appearance, no masses or tenderness, No nipple retraction or dimpling, No nipple discharge or bleeding, No axillary adenopathy Heart: regular rate and rhythm Abdomen: soft, non-tender; no masses, no organomegaly Extremities: extremities normal, atraumatic, no cyanosis or edema Skin: skin color, texture, turgor normal. No rashes or lesions Lymph nodes: cervical, supraclavicular, and axillary nodes normal. Neurologic: grossly normal  Pelvic: External genitalia:  no lesions              No abnormal inguinal nodes palpated.              Urethra:  normal appearing urethra with no masses, tenderness or lesions              Bartholins and Skenes: normal                 Vagina: normal appearing vagina with normal color and discharge, no lesions              Cervix: no lesions              Pap taken: Yes.   Bimanual Exam:  Uterus:  normal size, contour, position, consistency, mobility, non-tender              Adnexa: no mass, fullness, tenderness                    Chaperone was present for exam.  Assessment:   Well woman visit with normal exam. Oligomenorrhea post IVF cycles.  Migraine without aura.  Pancreatic insufficiency.   Plan: Mammogram screening age 30. Self breast awareness reviewed. Pap and HR HPV as above. Guidelines for Calcium, Vitamin D, regular exercise program including cardiovascular and weight bearing exercise. Flu vaccine recommended. Rx for TriSprintec - 3 packs and 3 refills. She will return for Nexplanon.  We did a literature review regarding pancreatic insufficiency and there is no known relationship with her hormonal treatment for IVF cycles.  Follow up annually and prn.   After visit summary provided.

## 2019-04-27 ENCOUNTER — Telehealth: Payer: Self-pay | Admitting: Obstetrics and Gynecology

## 2019-04-27 ENCOUNTER — Encounter: Payer: Self-pay | Admitting: Family Medicine

## 2019-04-27 ENCOUNTER — Ambulatory Visit (INDEPENDENT_AMBULATORY_CARE_PROVIDER_SITE_OTHER): Payer: Managed Care, Other (non HMO) | Admitting: Family Medicine

## 2019-04-27 DIAGNOSIS — F959 Tic disorder, unspecified: Secondary | ICD-10-CM | POA: Diagnosis not present

## 2019-04-27 DIAGNOSIS — F418 Other specified anxiety disorders: Secondary | ICD-10-CM

## 2019-04-27 LAB — PROLACTIN: Prolactin: 13.6 ng/mL (ref 4.8–23.3)

## 2019-04-27 LAB — FSH/LH
FSH: 5.7 m[IU]/mL
LH: 7 m[IU]/mL

## 2019-04-27 LAB — ESTRADIOL: Estradiol: 9.5 pg/mL

## 2019-04-27 MED ORDER — ESCITALOPRAM OXALATE 20 MG PO TABS: 20.0000 mg | ORAL_TABLET | Freq: Every day | ORAL | 3 refills | Status: DC

## 2019-04-27 NOTE — Progress Notes (Signed)
Chief Complaint  Patient presents with  . nervous tic    Subjective: Patient is a 28 y.o. female here for nervous tic. Due to COVID-19 pandemic, we are interacting via web portal for an electronic face-to-face visit. I verified patient's ID using 2 identifiers. Patient agreed to proceed with visit via this method. Patient is at home, I am at office. Patient and I are present for visit.   Over the past 2 weeks, the patient has noticed an increased desire to blink and is having strange sensation of her throat.  She has difficulty describing the latter.  Usually happens after she puts down her child in the evening.  It has been getting worse as it is affecting her earlier in the day.  No history of iron deficiency.  She has been undergoing more stress lately due to extrinsic factors.  She is on Lexapro 10 mg daily and tolerating well otherwise.  No new medications otherwise.  She is not on any stimulants.  ROS: Psych: +stress  Past Medical History:  Diagnosis Date  . Anxiety 12/10/2018   on lexapro  . Dysmenorrhea   . Exocrine pancreatic insufficiency 2020  . GERD (gastroesophageal reflux disease)   . No known health problems   . Obesity     Objective: LMP 04/21/2019 (Exact Date)  No conversational dyspnea Age appropriate judgment and insight Nml affect and mood  Assessment and Plan: Situational anxiety - Plan: escitalopram (LEXAPRO) 20 MG tablet  Tic - Plan: IBC + Ferritin  Increase dose of Lexapro from 10 mg daily to 20 mg daily.  Discussed guanfacine for possible facial tic though it is more likely related to stress.  Will check iron studies to ensure there is no deficiency contributing to this. Follow-up in 1 month recheck dose change.  Could add buspirone if still having issues. The patient voiced understanding and agreement to the plan.  [place], DO 04/27/19  1:26 PM

## 2019-04-27 NOTE — Telephone Encounter (Signed)
Call placed to convey benefits for Nexplanon. Unable to leave a message voicemail is full.

## 2019-05-02 LAB — CYTOLOGY - PAP
Diagnosis: NEGATIVE
High risk HPV: NEGATIVE

## 2019-05-17 ENCOUNTER — Other Ambulatory Visit: Payer: Self-pay

## 2019-05-17 MED ORDER — PANCRELIPASE (LIP-PROT-AMYL) 36000-114000 UNITS PO CPEP: ORAL_CAPSULE | ORAL | 4 refills | Status: DC

## 2019-05-19 ENCOUNTER — Encounter: Payer: Managed Care, Other (non HMO) | Admitting: Family Medicine

## 2019-05-23 NOTE — Telephone Encounter (Signed)
Patient is calling to schedule Nexplanon insertion. Patient stated that she started her cycle on Thursday (05/19/2019).

## 2019-05-23 NOTE — Telephone Encounter (Signed)
Left message to call Edvin Albus, RN at [facility] [phone].   

## 2019-05-23 NOTE — Telephone Encounter (Signed)
Spoke with patient. Patient is requesting to proceed with nexplanon insertion, discussed at 04/26/19 AEX. OCP for contraceptive. LMP 05/19/19.   Nexplanon insertion scheduled for 10/28 at 11am with Dr. Quincy Simmonds. Advised to take Motrin 800 mg with food and water one hour before procedure. Patient verbalizes understanding.   Order previously placed for precert.   Routing to provider for final review. Patient is agreeable to disposition. Will close encounter.  Cc: Lerry Liner, Magdalene Patricia

## 2019-05-23 NOTE — Telephone Encounter (Signed)
Patient is returning call to Jill. °

## 2019-05-24 ENCOUNTER — Telehealth: Payer: Self-pay | Admitting: Obstetrics and Gynecology

## 2019-05-24 NOTE — Telephone Encounter (Signed)
Call placed to convey benefits for Nexplanon.

## 2019-05-24 NOTE — Progress Notes (Signed)
GYNECOLOGY  VISIT   HPI: 28 y.o.   Married  Caucasian  female   G1P1 with Patient's last menstrual period was 05/18/2019 (exact date).   here for Nexplanon insertion.  Wants to return to [place].  She has had 2 Nexplanons in the past.  Did well with it in the past.   UPT: Neg  GYNECOLOGIC HISTORY: Patient's last menstrual period was 05/18/2019 (exact date). Contraception:  OCPs Menopausal hormone therapy:  n/a Last mammogram:  n/a Last pap smear:04-26-19 Neg:Neg HR HPV,  2019 normal per patient         OB History    Gravida  1   Para  1   Term      Preterm      AB      Living  1     SAB      TAB      Ectopic      Multiple      Live Births  1              Patient Active Problem List   Diagnosis Date Noted  . Situational anxiety 04/27/2019  . Abdominal cramping 03/12/2019  . Diarrhea 03/12/2019  . Other fatigue 03/12/2019  . Chronic diarrhea 02/03/2019  . Generalized abdominal pain 02/03/2019  . Rectal bleeding 02/03/2019  . Hemorrhoids 02/03/2019  . Exocrine pancreatic insufficiency 01/27/2019  . Change in bowel habits 12/03/2018  . Nausea without vomiting 12/03/2018  . Periumbilical abdominal pain 11/10/2018  . Non-intractable vomiting 11/10/2018  . Gastroesophageal reflux disease 05/17/2018  . Environmental allergies 05/17/2018  . Nexplanon insertion 04/01/2017    Past Medical History:  Diagnosis Date  . Anxiety 12/10/2018   on lexapro  . Dysmenorrhea   . Exocrine pancreatic insufficiency 2020  . GERD (gastroesophageal reflux disease)   . No known health problems   . Obesity     Past Surgical History:  Procedure Laterality Date  . OTHER SURGICAL HISTORY     Finger reattachment    Current Outpatient Medications  Medication Sig Dispense Refill  . escitalopram (LEXAPRO) 20 MG tablet Take 1 tablet (20 mg total) by mouth daily. 30 tablet 3  . levocetirizine (XYZAL) 5 MG tablet Take 5 mg by mouth as needed for allergies.    Marland Kitchen  lipase/protease/amylase (CREON) 36000 UNITS CPEP capsule Take 4 capsules with each meal. Take 1 to 2 capsules with each snack. 340 capsule 4  . Norgestimate-Ethinyl Estradiol Triphasic 0.18/0.215/0.25 MG-35 MCG tablet Take 1 tablet by mouth daily. 3 Package 3  . ondansetron (ZOFRAN ODT) 8 MG disintegrating tablet Take 1 tablet (8 mg total) by mouth every 8 (eight) hours as needed for nausea, vomiting or refractory nausea / vomiting. 30 tablet 1  . pantoprazole (PROTONIX) 40 MG tablet Take 1 tablet (40 mg total) by mouth daily. 90 tablet 1  . traZODone (DESYREL) 50 MG tablet Take 0.5-1 tablets (25-50 mg total) by mouth at bedtime as needed for sleep. 30 tablet 3   No current facility-administered medications for this visit.      ALLERGIES: Patient has no known allergies.  Family History  Problem Relation Age of Onset  . Hypertension Father   . Diabetes Maternal Grandfather   . Colon cancer Paternal Grandfather 67  . Esophageal cancer Neg Hx   . Inflammatory bowel disease Neg Hx   . Liver disease Neg Hx   . Pancreatic cancer Neg Hx   . Stomach cancer Neg Hx     Social History  Socioeconomic History  . Marital status: Married    Spouse name: Tory  . Number of children: 1  . Years of education: Not on file  . Highest education level: Not on file  Occupational History  . Not on file  Social Needs  . Financial resource strain: Not on file  . Food insecurity    Worry: Not on file    Inability: Not on file  . Transportation needs    Medical: Not on file    Non-medical: Not on file  Tobacco Use  . Smoking status: Never Smoker  . Smokeless tobacco: Never Used  Substance and Sexual Activity  . Alcohol use: Never    Frequency: Never  . Drug use: Never  . Sexual activity: Yes    Partners: Male    Birth control/protection: Condom    Comment: condoms everytime  Lifestyle  . Physical activity    Days per week: Not on file    Minutes per session: Not on file  . Stress: Not on  file  Relationships  . Social Herbalist on phone: Not on file    Gets together: Not on file    Attends religious service: Not on file    Active member of club or organization: Not on file    Attends meetings of clubs or organizations: Not on file    Relationship status: Not on file  . Intimate partner violence    Fear of current or ex partner: Not on file    Emotionally abused: Not on file    Physically abused: Not on file    Forced sexual activity: Not on file  Other Topics Concern  . Not on file  Social History Narrative  . Not on file    Review of Systems  All other systems reviewed and are negative.   PHYSICAL EXAMINATION:    BP 112/70 (Cuff Size: Large)   Pulse 80   Temp 97.7 F (36.5 C) (Temporal)   Ht 5' 8.5" (1.74 m)   Wt 200 lb (90.7 kg)   LMP 05/18/2019 (Exact Date)   BMI 29.97 kg/m     General appearance: alert, cooperative and appears stated age   Nexplanon insertion.  Left arm.  Lot number  [id number], expiration  06/21/21 Consent for procedure Sterile prep with betadine.  Measurements confirmed for proper placement according to old guidelines.  New guidelines were discussed and decision with patient and provider to use old scar site.   Local 1% lidocaine, lot number [id number], exp April/2022. Nexplanon placed without difficulty after rod confirmed by me and the nurse to be in the inserter. Betadine cleansed from skin.  Steristrips placed.   Nexplanon located by patient and provider.  Sterile gauze wrap placed.  Minimal EBL.  No complications.   Chaperone was present for exam.  ASSESSMENT  Nexplanon insertion.   PLAN  Post instructions and precautions given.  Back up protection for one week.  Ok to stop OCPs now or wait until the end of the pack.  Nexplanon card to patient.  Fu prn.    An After Visit Summary was printed and given to the patient.

## 2019-05-25 ENCOUNTER — Ambulatory Visit (INDEPENDENT_AMBULATORY_CARE_PROVIDER_SITE_OTHER): Payer: Managed Care, Other (non HMO) | Admitting: Obstetrics and Gynecology

## 2019-05-25 ENCOUNTER — Other Ambulatory Visit: Payer: Self-pay

## 2019-05-25 ENCOUNTER — Encounter: Payer: Self-pay | Admitting: Obstetrics and Gynecology

## 2019-05-25 VITALS — BP 112/70 | HR 80 | Temp 97.7°F | Ht 68.5 in | Wt 200.0 lb

## 2019-05-25 DIAGNOSIS — Z01812 Encounter for preprocedural laboratory examination: Secondary | ICD-10-CM

## 2019-05-25 DIAGNOSIS — Z30017 Encounter for initial prescription of implantable subdermal contraceptive: Secondary | ICD-10-CM

## 2019-05-25 DIAGNOSIS — Z3009 Encounter for other general counseling and advice on contraception: Secondary | ICD-10-CM

## 2019-05-25 LAB — POCT URINE PREGNANCY: Preg Test, Ur: NEGATIVE

## 2019-05-25 NOTE — Patient Instructions (Signed)
Etonogestrel implant What is this medicine? ETONOGESTREL (et oh noe JES trel) is a contraceptive (birth control) device. It is used to prevent pregnancy. It can be used for up to 3 years. This medicine may be used for other purposes; ask your health care provider or pharmacist if you have questions. COMMON BRAND NAME(S): Implanon, Nexplanon What should I tell my health care provider before I take this medicine? They need to know if you have any of these conditions:  abnormal vaginal bleeding  blood vessel disease or blood clots  breast, cervical, endometrial, ovarian, liver, or uterine cancer  diabetes  gallbladder disease  heart disease or recent heart attack  high blood pressure  high cholesterol or triglycerides  kidney disease  liver disease  migraine headaches  seizures  stroke  tobacco smoker  an unusual or allergic reaction to etonogestrel, anesthetics or antiseptics, other medicines, foods, dyes, or preservatives  pregnant or trying to get pregnant  breast-feeding How should I use this medicine? This device is inserted just under the skin on the inner side of your upper arm by a health care professional. Talk to your pediatrician regarding the use of this medicine in children. Special care may be needed. Overdosage: If you think you have taken too much of this medicine contact a poison control center or emergency room at once. NOTE: This medicine is only for you. Do not share this medicine with others. What if I miss a dose? This does not apply. What may interact with this medicine? Do not take this medicine with any of the following medications:  amprenavir  fosamprenavir This medicine may also interact with the following medications:  acitretin  aprepitant  armodafinil  bexarotene  bosentan  carbamazepine  certain medicines for fungal infections like fluconazole, ketoconazole, itraconazole and voriconazole  certain medicines to treat  hepatitis, HIV or AIDS  cyclosporine  felbamate  griseofulvin  lamotrigine  modafinil  oxcarbazepine  phenobarbital  phenytoin  primidone  rifabutin  rifampin  rifapentine  St. John's wort  topiramate This list may not describe all possible interactions. Give your health care provider a list of all the medicines, herbs, non-prescription drugs, or dietary supplements you use. Also tell them if you smoke, drink alcohol, or use illegal drugs. Some items may interact with your medicine. What should I watch for while using this medicine? This product does not protect you against HIV infection (AIDS) or other sexually transmitted diseases. You should be able to feel the implant by pressing your fingertips over the skin where it was inserted. Contact your doctor if you cannot feel the implant, and use a non-hormonal birth control method (such as condoms) until your doctor confirms that the implant is in place. Contact your doctor if you think that the implant may have broken or become bent while in your arm. You will receive a user card from your health care provider after the implant is inserted. The card is a record of the location of the implant in your upper arm and when it should be removed. Keep this card with your health records. What side effects may I notice from receiving this medicine? Side effects that you should report to your doctor or health care professional as soon as possible:  allergic reactions like skin rash, itching or hives, swelling of the face, lips, or tongue  breast lumps, breast tissue changes, or discharge  breathing problems  changes in emotions or moods  if you feel that the implant may have broken or   bent while in your arm  high blood pressure  pain, irritation, swelling, or bruising at the insertion site  scar at site of insertion  signs of infection at the insertion site such as fever, and skin redness, pain or discharge  signs and  symptoms of a blood clot such as breathing problems; changes in vision; chest pain; severe, sudden headache; pain, swelling, warmth in the leg; trouble speaking; sudden numbness or weakness of the face, arm or leg  signs and symptoms of liver injury like dark yellow or brown urine; general ill feeling or flu-like symptoms; light-colored stools; loss of appetite; nausea; right upper belly pain; unusually weak or tired; yellowing of the eyes or skin  unusual vaginal bleeding, discharge Side effects that usually do not require medical attention (report to your doctor or health care professional if they continue or are bothersome):  acne  breast pain or tenderness  headache  irregular menstrual bleeding  nausea This list may not describe all possible side effects. Call your doctor for medical advice about side effects. You may report side effects to FDA at 1-800-FDA-1088. Where should I keep my medicine? This drug is given in a hospital or clinic and will not be stored at home. NOTE: This sheet is a summary. It may not cover all possible information. If you have questions about this medicine, talk to your doctor, pharmacist, or health care provider.  2020 Elsevier/Gold Standard (2017-06-02 14:11:42)  

## 2019-05-30 ENCOUNTER — Other Ambulatory Visit: Payer: Self-pay

## 2019-05-31 ENCOUNTER — Ambulatory Visit (INDEPENDENT_AMBULATORY_CARE_PROVIDER_SITE_OTHER): Payer: Managed Care, Other (non HMO) | Admitting: Family Medicine

## 2019-05-31 ENCOUNTER — Encounter: Payer: Self-pay | Admitting: Family Medicine

## 2019-05-31 VITALS — BP 108/68 | HR 108 | Temp 97.1°F | Ht 68.0 in | Wt 197.0 lb

## 2019-05-31 DIAGNOSIS — Z0000 Encounter for general adult medical examination without abnormal findings: Secondary | ICD-10-CM

## 2019-05-31 DIAGNOSIS — K529 Noninfective gastroenteritis and colitis, unspecified: Secondary | ICD-10-CM

## 2019-05-31 DIAGNOSIS — G5601 Carpal tunnel syndrome, right upper limb: Secondary | ICD-10-CM

## 2019-05-31 DIAGNOSIS — F418 Other specified anxiety disorders: Secondary | ICD-10-CM | POA: Diagnosis not present

## 2019-05-31 DIAGNOSIS — K8681 Exocrine pancreatic insufficiency: Secondary | ICD-10-CM

## 2019-05-31 MED ORDER — ONDANSETRON 8 MG PO TBDP: 8.0000 mg | ORAL_TABLET | Freq: Three times a day (TID) | ORAL | 3 refills | Status: DC | PRN

## 2019-05-31 MED ORDER — CHOLESTYRAMINE LIGHT 4 G PO PACK: 4.0000 g | PACK | Freq: Two times a day (BID) | ORAL | 2 refills | Status: DC

## 2019-05-31 MED ORDER — MIRTAZAPINE 30 MG PO TABS: 30.0000 mg | ORAL_TABLET | Freq: Every day | ORAL | 3 refills | Status: DC

## 2019-05-31 MED ORDER — TRAZODONE HCL 50 MG PO TABS: 25.0000 mg | ORAL_TABLET | Freq: Every evening | ORAL | 12 refills | Status: DC | PRN

## 2019-05-31 MED ORDER — ESCITALOPRAM OXALATE 20 MG PO TABS: 20.0000 mg | ORAL_TABLET | Freq: Every day | ORAL | 12 refills | Status: DC

## 2019-05-31 NOTE — Progress Notes (Signed)
Chief Complaint  Patient presents with  . Annual Exam     Well Woman Wanda Acosta is here for a complete physical.   Her last physical was >1 year ago.  Current diet: in general, diet could be better. Current exercise: cardio, wt resistance. Contraception? Yes Patient's last menstrual period was 05/18/2019 (exact date).  Seatbelt? Yes  Health Maintenance Pap/HPV- Yes Tetanus- Yes HIV screening- Yes   Patient has a history of carpal tunnel syndrome on the right side.  She uses her splint at night when symptoms flare.  Her splint is quite old and she is requesting a new one.  The patient has a history of exocrine pancreatic insufficiency.  She is currently following with gastroenterology and is on Creon 4 times daily.  She is still having frequent diarrhea after meals.  She is losing weight.  Exercises helped a little bit.  She still having nausea in the evenings as well.  She is taking Zofran for this.  Protonix is also recommended by the gastroenterology team.  The next step if she does not improve on current therapy is to see a rheumatologist.  Patient has a history of situational anxiety.  She is on Lexapro 20 mg daily which she believes is helpful.  She is compliant and denies any side effects.  She is also on trazodone 25-50 mg every night that is helpful with sleep.  She is losing her job and due to the pandemic, her anxiety is quite high.  She has not done well with Wellbutrin or BuSpar in the past.  She has never been on mirtazapine.  Past Medical History:  Diagnosis Date  . Anxiety 12/10/2018   on lexapro  . Dysmenorrhea   . Exocrine pancreatic insufficiency 2020  . GERD (gastroesophageal reflux disease)   . No known health problems   . Obesity      Past Surgical History:  Procedure Laterality Date  . OTHER SURGICAL HISTORY     Finger reattachment    Medications  Current Outpatient Medications on File Prior to Visit  Medication Sig Dispense Refill  . escitalopram  (LEXAPRO) 20 MG tablet Take 1 tablet (20 mg total) by mouth daily. 30 tablet 3  . etonogestrel (NEXPLANON) 68 MG IMPL implant 1 each by Subdermal route once.    Marland Kitchen levocetirizine (XYZAL) 5 MG tablet Take 5 mg by mouth as needed for allergies.    Marland Kitchen lipase/protease/amylase (CREON) 36000 UNITS CPEP capsule Take 4 capsules with each meal. Take 1 to 2 capsules with each snack. 340 capsule 4  . ondansetron (ZOFRAN ODT) 8 MG disintegrating tablet Take 1 tablet (8 mg total) by mouth every 8 (eight) hours as needed for nausea, vomiting or refractory nausea / vomiting. 30 tablet 1  . pantoprazole (PROTONIX) 40 MG tablet Take 1 tablet (40 mg total) by mouth daily. 90 tablet 1  . traZODone (DESYREL) 50 MG tablet Take 0.5-1 tablets (25-50 mg total) by mouth at bedtime as needed for sleep. 30 tablet 3   Allergies No Known Allergies  Review of Systems: Constitutional:  no unexpected weight changes Eye:  no recent significant change in vision Ear/Nose/Mouth/Throat:  Ears:  no tinnitus or vertigo and no recent change in hearing Nose/Mouth/Throat:  no complaints of nasal congestion, no sore throat Cardiovascular: no chest pain Respiratory:  no cough and no shortness of breath Gastrointestinal: +chronic diarrhea and abd pain GU:  Female: negative for dysuria or pelvic pain Musculoskeletal/Extremities:  no pain of the joints Integumentary (Skin/Breast):  no abnormal skin lesions reported Neurologic: +CTS Endocrine:  denies fatigue Hematologic/Lymphatic:  No areas of easy bleeding  Exam BP 108/68 (BP Location: Left Arm, Patient Position: Sitting, Cuff Size: Normal)   Pulse (!) 108   Temp (!) 97.1 F (36.2 C) (Temporal)   Ht 5\' 8"  (1.727 m)   Wt 197 lb (89.4 kg)   LMP 05/18/2019 (Exact Date)   SpO2 97%   BMI 29.95 kg/m  General:  well developed, well nourished, in no apparent distress Skin:  no significant moles, warts, or growths Head:  no masses, lesions, or tenderness Eyes:  pupils equal and  round, sclera anicteric without injection Ears:  canals without lesions, TMs shiny without retraction, no obvious effusion, no erythema Nose:  nares patent, septum midline, mucosa normal, and no drainage or sinus tenderness Throat/Pharynx:  lips and gingiva without lesion; tongue and uvula midline; non-inflamed pharynx; no exudates or postnasal drainage Neck: neck supple without adenopathy, thyromegaly, or masses Lungs:  clear to auscultation, breath sounds equal bilaterally, no respiratory distress Cardio:  regular rate and rhythm, no bruits, no LE edema Abdomen:  abdomen soft, nontender; bowel sounds normal; no masses or organomegaly Genital: Defer to GYN Musculoskeletal:  symmetrical muscle groups noted without atrophy or deformity Extremities:  no clubbing, cyanosis, or edema, no deformities, no skin discoloration Neuro:  gait normal; deep tendon reflexes normal and symmetric Psych: well oriented with normal range of affect and appropriate judgment/insight  Assessment and Plan  Well adult exam - Plan: CBC, Comprehensive metabolic panel, Lipid panel  Chronic diarrhea - Plan: Ambulatory referral to Gastroenterology, cholestyramine light (PREVALITE) 4 g packet  Exocrine pancreatic insufficiency - Plan: ondansetron (ZOFRAN ODT) 8 MG disintegrating tablet, Ambulatory referral to Gastroenterology  Situational anxiety - Plan: traZODone (DESYREL) 50 MG tablet, escitalopram (LEXAPRO) 20 MG tablet, mirtazapine (REMERON) 30 MG tablet  Carpal tunnel syndrome of right wrist   Well 28 y.o. female. Counseled on diet and exercise. Refer to [facility] for 2nd opinion rather than see rheum at this point.  Trial Questran for diarrhea. Cont Zofran prn.  Cont Lexapro, trazodone, add Remeron.  The weight gain may be beneficial for her. If her splint was given for carpal tunnel syndrome.  If things do not improve, we can do an injection here. Other orders as above. Follow up in 1 yr. The patient voiced  understanding and agreement to the plan.  [place], DO 05/31/19 11:53 AM

## 2019-05-31 NOTE — Patient Instructions (Signed)
Please consider counseling. Contact [phone] to schedule an appointment or inquire about cost/insurance coverage.  Give [place] 2-3 business days to get the results of your labs back.   Keep the diet clean and stay active.  If you do not hear anything about your referral in the next 1-2 weeks, call our office and ask for an update.  [facility] [address] [place], [place] [address] [phone]  [facility] [address] [place], [place] [address] [phone]  [facility] [place], [place] [address] [phone]  [facility] [address], [place], [place], [phone] [address], [place], [place], [place]  [facility] [place], [place], [place], [place]  [facility] [place], [place]  Call one of these offices sooner than later as it can take 2-3 months to get a new patient appointment.   Let us know if you need anything.

## 2019-06-01 ENCOUNTER — Emergency Department (HOSPITAL_COMMUNITY)
Admission: EM | Admit: 2019-06-01 | Discharge: 2019-06-01 | Disposition: A | Discharge status: Home / Self Care | Payer: Managed Care, Other (non HMO) | Attending: Emergency Medicine | Admitting: Emergency Medicine

## 2019-06-01 ENCOUNTER — Other Ambulatory Visit: Payer: Self-pay

## 2019-06-01 DIAGNOSIS — W260XXA Contact with knife, initial encounter: Secondary | ICD-10-CM | POA: Insufficient documentation

## 2019-06-01 DIAGNOSIS — Z79899 Other long term (current) drug therapy: Secondary | ICD-10-CM | POA: Insufficient documentation

## 2019-06-01 DIAGNOSIS — Y999 Unspecified external cause status: Secondary | ICD-10-CM | POA: Insufficient documentation

## 2019-06-01 DIAGNOSIS — Y939 Activity, unspecified: Secondary | ICD-10-CM | POA: Insufficient documentation

## 2019-06-01 DIAGNOSIS — Y929 Unspecified place or not applicable: Secondary | ICD-10-CM | POA: Diagnosis not present

## 2019-06-01 DIAGNOSIS — S6992XA Unspecified injury of left wrist, hand and finger(s), initial encounter: Secondary | ICD-10-CM | POA: Diagnosis present

## 2019-06-01 DIAGNOSIS — S61213A Laceration without foreign body of left middle finger without damage to nail, initial encounter: Secondary | ICD-10-CM

## 2019-06-01 NOTE — ED Triage Notes (Signed)
Patient reports she was cooking dinner and cut her finger with a knife. Patient reports she is up to date on her tetanus. Patient has a pressure dressing applied to control the bleeding

## 2019-06-01 NOTE — ED Provider Notes (Signed)
[facility] DEPT Provider Note   CSN: [id number] Arrival date & time: 06/01/19  1757     History   Chief Complaint Chief Complaint  Patient presents with  . Finger Injury    HPI Wanda Acosta is a 28 y.o. female.     Patient is a 28 year old female with past medical history of anxiety, GERD presenting to the emergency department for laceration to the left middle finger.  Reports that around 430 she was using a knife to cut open a package of spice and the knife went through and cut the distal end of her finger.  Bleeding is controlled.  Up-to-date on her tetanus.     Past Medical History:  Diagnosis Date  . Anxiety 12/10/2018   on lexapro  . Dysmenorrhea   . Exocrine pancreatic insufficiency 2020  . GERD (gastroesophageal reflux disease)   . No known health problems   . Obesity     Patient Active Problem List   Diagnosis Date Noted  . Situational anxiety 04/27/2019  . Abdominal cramping 03/12/2019  . Diarrhea 03/12/2019  . Other fatigue 03/12/2019  . Chronic diarrhea 02/03/2019  . Generalized abdominal pain 02/03/2019  . Rectal bleeding 02/03/2019  . Hemorrhoids 02/03/2019  . Exocrine pancreatic insufficiency 01/27/2019  . Change in bowel habits 12/03/2018  . Nausea without vomiting 12/03/2018  . Periumbilical abdominal pain 11/10/2018  . Non-intractable vomiting 11/10/2018  . Gastroesophageal reflux disease 05/17/2018  . Environmental allergies 05/17/2018  . Nexplanon insertion 04/01/2017    Past Surgical History:  Procedure Laterality Date  . OTHER SURGICAL HISTORY     Finger reattachment     OB History    Gravida  1   Para  1   Term      Preterm      AB      Living  1     SAB      TAB      Ectopic      Multiple      Live Births  1            Home Medications    Prior to Admission medications   Medication Sig Start Date End Date Taking? Authorizing Provider  cholestyramine light (PREVALITE) 4 g  packet Take 1 packet (4 g total) by mouth 2 (two) times daily. 05/31/19   Shelda Pal, DO  escitalopram (LEXAPRO) 20 MG tablet Take 1 tablet (20 mg total) by mouth daily. 05/31/19   Shelda Pal, DO  etonogestrel (NEXPLANON) 68 MG IMPL implant 1 each by Subdermal route once.    [provider]  levocetirizine (XYZAL) 5 MG tablet Take 5 mg by mouth as needed for allergies.    [provider]  lipase/protease/amylase (CREON) 36000 UNITS CPEP capsule Take 4 capsules with each meal. Take 1 to 2 capsules with each snack. 05/17/19   Mansouraty, Telford Nab., MD  mirtazapine (REMERON) 30 MG tablet Take 1 tablet (30 mg total) by mouth at bedtime. 05/31/19   Shelda Pal, DO  ondansetron (ZOFRAN ODT) 8 MG disintegrating tablet Take 1 tablet (8 mg total) by mouth every 8 (eight) hours as needed for nausea, vomiting or refractory nausea / vomiting. 05/31/19   Shelda Pal, DO  pantoprazole (PROTONIX) 40 MG tablet Take 1 tablet (40 mg total) by mouth daily. 03/10/19 06/08/19  Mansouraty, Telford Nab., MD  traZODone (DESYREL) 50 MG tablet Take 0.5-1 tablets (25-50 mg total) by mouth at bedtime as needed for  sleep. 05/31/19   Shelda Pal, DO    Family History Family History  Problem Relation Age of Onset  . Hypertension Father   . Diabetes Maternal Grandfather   . Colon cancer Paternal Grandfather 64  . Esophageal cancer Neg Hx   . Inflammatory bowel disease Neg Hx   . Liver disease Neg Hx   . Pancreatic cancer Neg Hx   . Stomach cancer Neg Hx     Social History Social History   Tobacco Use  . Smoking status: Never Smoker  . Smokeless tobacco: Never Used  Substance Use Topics  . Alcohol use: Never    Frequency: Never  . Drug use: Never     Allergies   Patient has no known allergies.   Review of Systems Review of Systems  Constitutional: Negative for chills and fever.  Skin: Positive for wound. Negative for rash.   Allergic/Immunologic: Negative for immunocompromised state.  Hematological: Does not bruise/bleed easily.     Physical Exam Updated Vital Signs BP (!) 144/91 (BP Location: Left Arm)   Pulse 89   Temp 99.6 F (37.6 C)   Resp 17   LMP 05/18/2019 (Exact Date)   SpO2 99%   Physical Exam Vitals signs and nursing note reviewed.  Constitutional:      Appearance: Normal appearance.  HENT:     Head: Normocephalic.  Eyes:     Conjunctiva/sclera: Conjunctivae normal.  Pulmonary:     Effort: Pulmonary effort is normal.  Skin:    General: Skin is dry.     Comments: 29mm laceration to distal lateral left middle finger, bleeding controlled. 30mm deep  Neurological:     Mental Status: She is alert.  Psychiatric:        Mood and Affect: Mood normal.      ED Treatments / Results  Labs (all labs ordered are listed, but only abnormal results are displayed) Labs Reviewed - No data to display  EKG None  Radiology No results found.  Procedures Procedures (including critical care time)  Medications Ordered in ED Medications - No data to display   Initial Impression / Assessment and Plan / ED Course  I have reviewed the triage vital signs and the nursing notes.  Pertinent labs & imaging results that were available during my care of the patient were reviewed by me and considered in my medical decision making (see chart for details).       Based on review of vitals, medical screening exam, lab work and/or imaging, there does not appear to be an acute, emergent etiology for the patient's symptoms. Counseled pt on good return precautions and encouraged both PCP and ED follow-up as needed.  Prior to discharge, I also discussed incidental imaging findings with patient in detail and advised appropriate, recommended follow-up in detail.  Clinical Impression: 1. Laceration of left middle finger without foreign body without damage to nail, initial encounter     Disposition:  Discharge  Prior to providing a prescription for a controlled substance, I independently reviewed the patient's recent prescription history on the [place]. The patient had no recent or regular prescriptions and was deemed appropriate for a brief, less than 3 day prescription of narcotic for acute analgesia.  This note was prepared with assistance of Systems analyst. Occasional wrong-word or sound-a-like substitutions may have occurred due to the inherent limitations of voice recognition software.   Final Clinical Impressions(s) / ED Diagnoses   Final diagnoses:  Laceration  of left middle finger without foreign body without damage to nail, initial encounter    ED Discharge Orders    None       Kristine Royal 06/01/19 2128    Milton Ferguson, MD 06/01/19 2248

## 2019-06-01 NOTE — ED Notes (Signed)
An After Visit Summary was printed and given to the patient. Discharge instructions given and no further questions at this time.  

## 2019-06-01 NOTE — Discharge Instructions (Addendum)
You are seen today for laceration to your middle finger.  It was repaired with skin adhesive.  Please try to keep this area dry for about 24 to 48 hours.  After that you may wash with normal soap and water and keep it covered with a regular bandage.  In about 7 days the glue will dissolve on its own.  If you have any signs of infection such as increased redness or pus coming from the wound please follow-up as soon as possible.

## 2019-06-02 ENCOUNTER — Ambulatory Visit: Payer: Self-pay | Admitting: *Deleted

## 2019-06-02 NOTE — Telephone Encounter (Signed)
Pt went to the ED yesterday for a cut on her left middle finger. Her finger was cleaned and glued shut per pt.  She stated today that she felt really tired and took a nap and her temp is 99.9. She took the dressing off her finger and it looks to be a purplish red and really hurts.  She is advised to go back to the ED to have it examined. She voiced understanding.   Routing to LB at [facility] for review.

## 2019-06-03 ENCOUNTER — Emergency Department (HOSPITAL_COMMUNITY)
Admission: EM | Admit: 2019-06-03 | Discharge: 2019-06-03 | Disposition: A | Discharge status: Home / Self Care | Payer: Managed Care, Other (non HMO) | Attending: Emergency Medicine | Admitting: Emergency Medicine

## 2019-06-03 ENCOUNTER — Other Ambulatory Visit: Payer: Self-pay

## 2019-06-03 ENCOUNTER — Encounter (HOSPITAL_COMMUNITY): Payer: Self-pay

## 2019-06-03 ENCOUNTER — Emergency Department (HOSPITAL_COMMUNITY): Payer: Managed Care, Other (non HMO)

## 2019-06-03 DIAGNOSIS — Z20822 Contact with and (suspected) exposure to covid-19: Secondary | ICD-10-CM

## 2019-06-03 DIAGNOSIS — Z79899 Other long term (current) drug therapy: Secondary | ICD-10-CM | POA: Diagnosis not present

## 2019-06-03 DIAGNOSIS — Z20828 Contact with and (suspected) exposure to other viral communicable diseases: Secondary | ICD-10-CM | POA: Insufficient documentation

## 2019-06-03 DIAGNOSIS — R509 Fever, unspecified: Secondary | ICD-10-CM | POA: Diagnosis present

## 2019-06-03 DIAGNOSIS — R52 Pain, unspecified: Secondary | ICD-10-CM | POA: Diagnosis not present

## 2019-06-03 LAB — CBC WITH DIFFERENTIAL/PLATELET
Abs Immature Granulocytes: 0.03 10*3/uL (ref 0.00–0.07)
Basophils Absolute: 0.1 10*3/uL (ref 0.0–0.1)
Basophils Relative: 1 %
Eosinophils Absolute: 0.2 10*3/uL (ref 0.0–0.5)
Eosinophils Relative: 2 %
HCT: 45.7 % (ref 36.0–46.0)
Hemoglobin: 14.2 g/dL (ref 12.0–15.0)
Immature Granulocytes: 0 %
Lymphocytes Relative: 31 %
Lymphs Abs: 2.7 10*3/uL (ref 0.7–4.0)
MCH: 26.7 pg (ref 26.0–34.0)
MCHC: 31.1 g/dL (ref 30.0–36.0)
MCV: 86.1 fL (ref 80.0–100.0)
Monocytes Absolute: 0.5 10*3/uL (ref 0.1–1.0)
Monocytes Relative: 6 %
Neutro Abs: 5.3 10*3/uL (ref 1.7–7.7)
Neutrophils Relative %: 60 %
Platelets: 287 10*3/uL (ref 150–400)
RBC: 5.31 MIL/uL — ABNORMAL HIGH (ref 3.87–5.11)
RDW: 12.6 % (ref 11.5–15.5)
WBC: 8.8 10*3/uL (ref 4.0–10.5)
nRBC: 0 % (ref 0.0–0.2)

## 2019-06-03 LAB — COMPREHENSIVE METABOLIC PANEL
ALT: 17 U/L (ref 0–44)
AST: 17 U/L (ref 15–41)
Albumin: 4.6 g/dL (ref 3.5–5.0)
Alkaline Phosphatase: 86 U/L (ref 38–126)
Anion gap: 9 (ref 5–15)
BUN: 15 mg/dL (ref 6–20)
CO2: 22 mmol/L (ref 22–32)
Calcium: 9.6 mg/dL (ref 8.9–10.3)
Chloride: 109 mmol/L (ref 98–111)
Creatinine, Ser: 0.9 mg/dL (ref 0.44–1.00)
GFR calc Af Amer: >60 mL/min (ref >60)
GFR calc non Af Amer: >60 mL/min (ref >60)
Glucose, Bld: 91 mg/dL (ref 70–99)
Potassium: 4 mmol/L (ref 3.5–5.1)
Sodium: 140 mmol/L (ref 135–145)
Total Bilirubin: 0.7 mg/dL (ref 0.3–1.2)
Total Protein: 8.3 g/dL — ABNORMAL HIGH (ref 6.5–8.1)

## 2019-06-03 LAB — URINALYSIS, ROUTINE W REFLEX MICROSCOPIC
Bilirubin Urine: NEGATIVE
Glucose, UA: NEGATIVE mg/dL
Ketones, ur: NEGATIVE mg/dL
Nitrite: NEGATIVE
Protein, ur: NEGATIVE mg/dL
Specific Gravity, Urine: 1.019 (ref 1.005–1.030)
pH: 5 (ref 5.0–8.0)

## 2019-06-03 LAB — LACTIC ACID, PLASMA: Lactic Acid, Venous: 1.6 mmol/L (ref 0.5–1.9)

## 2019-06-03 LAB — SARS CORONAVIRUS 2 (TAT 6-24 HRS): SARS Coronavirus 2: NEGATIVE

## 2019-06-03 MED ORDER — SODIUM CHLORIDE 0.9 % IV BOLUS
1000.0000 mL | Freq: Once | INTRAVENOUS | Status: AC
  Administered 2019-06-03: 1000 mL via INTRAVENOUS

## 2019-06-03 NOTE — Discharge Instructions (Signed)
Your symptoms may be due to covid-19 infection.  If tested positive, you will be notify in the next 2-3 days.  Follow instruction below. Return if you have any concerns.

## 2019-06-03 NOTE — ED Provider Notes (Signed)
[facility] DEPT Provider Note   CSN: [id number] Arrival date & time: 06/03/19  1100     History   Chief Complaint Chief Complaint  Patient presents with  . Generalized Body Aches  . Fever    HPI Wanda Acosta is a 28 y.o. female.     The history is provided by the patient. No language interpreter was used.  Fever    29 year old female presenting complaining of not feeling well.  Patient states since yesterday she has had fever as high as 101, chills, body aches, not feeling well.  She endorsed some minimal headache.  She does not complain of any runny nose sneezing or coughing no chest pain shortness of breath no abdominal pain no dysuria no nausea vomiting or diarrhea and no rash.  She accidentally cut her left middle finger while cooking 2 days ago was seen in the ED and had superglue applied to it.  She denies any significant pain to her finger.  She denies any recent sick contact with anyone with COVID-19.  Past Medical History:  Diagnosis Date  . Anxiety 12/10/2018   on lexapro  . Dysmenorrhea   . Exocrine pancreatic insufficiency 2020  . GERD (gastroesophageal reflux disease)   . No known health problems   . Obesity     Patient Active Problem List   Diagnosis Date Noted  . Situational anxiety 04/27/2019  . Abdominal cramping 03/12/2019  . Diarrhea 03/12/2019  . Other fatigue 03/12/2019  . Chronic diarrhea 02/03/2019  . Generalized abdominal pain 02/03/2019  . Rectal bleeding 02/03/2019  . Hemorrhoids 02/03/2019  . Exocrine pancreatic insufficiency 01/27/2019  . Change in bowel habits 12/03/2018  . Nausea without vomiting 12/03/2018  . Periumbilical abdominal pain 11/10/2018  . Non-intractable vomiting 11/10/2018  . Gastroesophageal reflux disease 05/17/2018  . Environmental allergies 05/17/2018  . Nexplanon insertion 04/01/2017    Past Surgical History:  Procedure Laterality Date  . OTHER SURGICAL HISTORY     Finger  reattachment     OB History    Gravida  1   Para  1   Term      Preterm      AB      Living  1     SAB      TAB      Ectopic      Multiple      Live Births  1            Home Medications    Prior to Admission medications   Medication Sig Start Date End Date Taking? Authorizing Provider  cholestyramine light (PREVALITE) 4 g packet Take 1 packet (4 g total) by mouth 2 (two) times daily. 05/31/19   Shelda Pal, DO  escitalopram (LEXAPRO) 20 MG tablet Take 1 tablet (20 mg total) by mouth daily. 05/31/19   Shelda Pal, DO  etonogestrel (NEXPLANON) 68 MG IMPL implant 1 each by Subdermal route once.    [provider]  levocetirizine (XYZAL) 5 MG tablet Take 5 mg by mouth as needed for allergies.    [provider]  lipase/protease/amylase (CREON) 36000 UNITS CPEP capsule Take 4 capsules with each meal. Take 1 to 2 capsules with each snack. 05/17/19   Mansouraty, Telford Nab., MD  mirtazapine (REMERON) 30 MG tablet Take 1 tablet (30 mg total) by mouth at bedtime. 05/31/19   Shelda Pal, DO  ondansetron (ZOFRAN ODT) 8 MG disintegrating tablet Take 1 tablet (8 mg total) by  mouth every 8 (eight) hours as needed for nausea, vomiting or refractory nausea / vomiting. 05/31/19   Shelda Pal, DO  pantoprazole (PROTONIX) 40 MG tablet Take 1 tablet (40 mg total) by mouth daily. 03/10/19 06/08/19  Mansouraty, Telford Nab., MD  traZODone (DESYREL) 50 MG tablet Take 0.5-1 tablets (25-50 mg total) by mouth at bedtime as needed for sleep. 05/31/19   Shelda Pal, DO    Family History Family History  Problem Relation Age of Onset  . Hypertension Father   . Diabetes Maternal Grandfather   . Colon cancer Paternal Grandfather 70  . Esophageal cancer Neg Hx   . Inflammatory bowel disease Neg Hx   . Liver disease Neg Hx   . Pancreatic cancer Neg Hx   . Stomach cancer Neg Hx     Social History Social History    Tobacco Use  . Smoking status: Never Smoker  . Smokeless tobacco: Never Used  Substance Use Topics  . Alcohol use: Never    Frequency: Never  . Drug use: Never     Allergies   Patient has no known allergies.   Review of Systems Review of Systems  Constitutional: Positive for fever.  All other systems reviewed and are negative.    Physical Exam Updated Vital Signs BP (!) 137/101 (BP Location: Left Arm)   Pulse (!) 125   Temp 99.7 F (37.6 C) (Oral)   Resp 18   Ht 5\' 8"  (1.727 m)   Wt 89 kg   LMP 05/18/2019 (Exact Date)   SpO2 99%   BMI 29.83 kg/m   Physical Exam Vitals signs and nursing note reviewed.  Constitutional:      General: She is not in acute distress.    Appearance: She is well-developed.  HENT:     Head: Atraumatic.  Eyes:     Conjunctiva/sclera: Conjunctivae normal.  Neck:     Musculoskeletal: Normal range of motion and neck supple. No neck rigidity or muscular tenderness.  Cardiovascular:     Rate and Rhythm: Tachycardia present.     Pulses: Normal pulses.     Heart sounds: Normal heart sounds.  Pulmonary:     Effort: Pulmonary effort is normal.     Breath sounds: Normal breath sounds. No wheezing, rhonchi or rales.  Abdominal:     Palpations: Abdomen is soft.     Tenderness: There is no abdominal tenderness.  Lymphadenopathy:     Cervical: No cervical adenopathy.  Skin:    Findings: No rash.  Neurological:     Mental Status: She is alert and oriented to person, place, and time.  Psychiatric:        Mood and Affect: Mood normal.      ED Treatments / Results  Labs (all labs ordered are listed, but only abnormal results are displayed) Labs Reviewed  COMPREHENSIVE METABOLIC PANEL - Abnormal; Notable for the following components:      Result Value   Total Protein 8.3 (*)    All other components within normal limits  CBC WITH DIFFERENTIAL/PLATELET - Abnormal; Notable for the following components:   RBC 5.31 (*)    All other  components within normal limits  URINALYSIS, ROUTINE W REFLEX MICROSCOPIC - Abnormal; Notable for the following components:   Hgb urine dipstick MODERATE (*)    Leukocytes,Ua TRACE (*)    Bacteria, UA RARE (*)    All other components within normal limits  URINE CULTURE  SARS CORONAVIRUS 2 (TAT 6-24 HRS)  LACTIC  ACID, PLASMA  LACTIC ACID, PLASMA  I-STAT BETA HCG BLOOD, ED ([facility], [facility], [facility] ONLY)    EKG EKG Interpretation  Date/Time:  Friday June 03 2019 12:09:30 EST Ventricular Rate:  90 PR Interval:    QRS Duration: 85 QT Interval:  355 QTC Calculation: 435 R Axis:   68 Text Interpretation: Sinus rhythm Baseline wander in lead(s) II III aVF No STEMI Confirmed by Octaviano Glow [phone]) on 06/03/2019 1:39:16 PM   Radiology Dg Chest Port 1 View  Result Date: 06/03/2019 CLINICAL DATA:  Fever.  Body aches.  Chills. EXAM: PORTABLE CHEST 1 VIEW COMPARISON:  No prior. FINDINGS: Mediastinum hilar structures normal. Lungs are clear of acute infiltrates. Heart size normal. No pleural effusion or pneumothorax. IMPRESSION: Stable chest.  No acute cardiopulmonary disease identified. Electronically Signed   By: Marcello Moores  Register   On: 06/03/2019 12:34    Procedures Procedures (including critical care time)  Medications Ordered in ED Medications  sodium chloride 0.9 % bolus 1,000 mL (1,000 mLs Intravenous New Bag/Given 06/03/19 1231)     Initial Impression / Assessment and Plan / ED Course  I have reviewed the triage vital signs and the nursing notes.  Pertinent labs & imaging results that were available during my care of the patient were reviewed by me and considered in my medical decision making (see chart for details).        BP 138/89   Pulse 79   Temp 99.7 F (37.6 C) (Oral)   Resp 15   Ht 5\' 8"  (AB-123456789 m)   Wt 89 kg   LMP 05/18/2019 (Exact Date)   SpO2 100%   BMI 29.83 kg/m    Final Clinical Impressions(s) / ED Diagnoses   Final diagnoses:  Suspected COVID-19 virus  infection    ED Discharge Orders    None     11:57 AM Patient endorsed fever chills body aches.  No other symptom.  No loss of taste or smell or any recent sick contact.  She is tachycardic with initial heart rate of 125.  Oral temperature is 99.7.  No hypoxia, blood pressure stable.  Work-up initiated.  2:47 PM Labs are reassuring, normal lactic acid, electrolyte panels, normal WBC, normal H&H, urinalysis with moderate hemoglobin and urine dipsticks however patient is currently not on any menstruation.  She does not have any abdominal pain or CVA tenderness to suggest kidney stone.  She denies any pelvic pain.  Initially patient was tachycardic however on vital sign recheck after IV fluid, heart rate normalized.  Suspect her symptoms may be due to COVID-19.  Encourage patient to self quarantine, and will notify patient if tested positive for COVID-19 in the next 2 to 3 days.  Return precaution discussed.   Domenic Moras, PA-C 06/03/19 1451    Wyvonnia Dusky, MD 06/04/19 1137

## 2019-06-03 NOTE — ED Triage Notes (Signed)
Pt reports she was here 11/4 for cutting her finger with a knife while cooking dinner. Pt states she started having generalized body aches and fever yesterday evening. Pt denies SHOB, cough, chest pain, back pain, and abdominal pain.

## 2019-06-03 NOTE — Telephone Encounter (Signed)
Called left message to call back. The patient currently today at the ED.

## 2019-06-04 LAB — URINE CULTURE

## 2019-06-17 IMAGING — CT CT ABDOMEN AND PELVIS WITH CONTRAST
2 of 4 series · 16 of 46 positions shown, 18 images · IV contrast (APPLIED)
Comparison: None.

CLINICAL DATA: Severe mid abdominal pain, tachycardia.

EXAM:
CT ABDOMEN AND PELVIS WITH CONTRAST
TECHNIQUE: Multidetector CT imaging of the abdomen and pelvis was performed
using the standard protocol following bolus administration of
intravenous contrast.
CONTRAST:  100mL OMNIPAQUE IOHEXOL 300 MG/ML  SOLN

[Series 3: abdomen 5.0 · axial · 0.93mm/px · z∈[+755,+1200]mm · 13 of 103 slices shown, 15 images]
[im 7/103  soft-tissue]
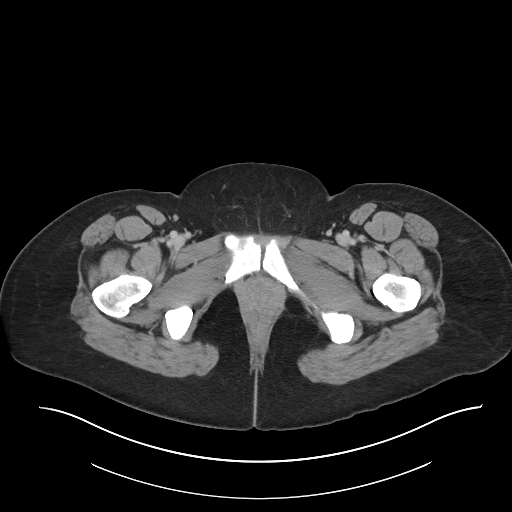
[im 7/103  bone]
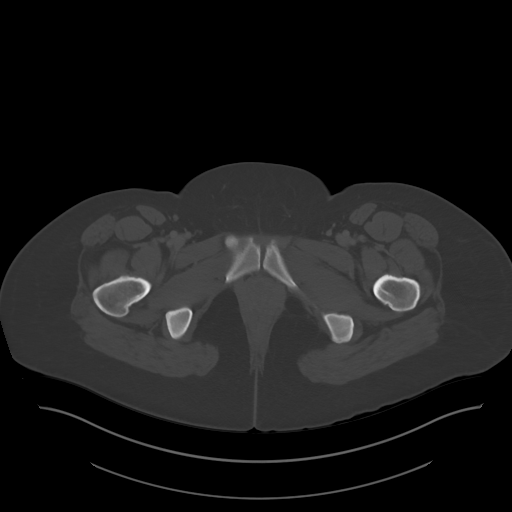
[im 13/103  soft-tissue]
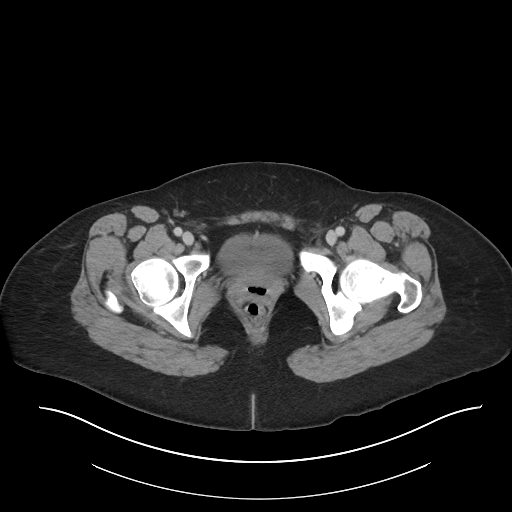
[im 20/103  soft-tissue]
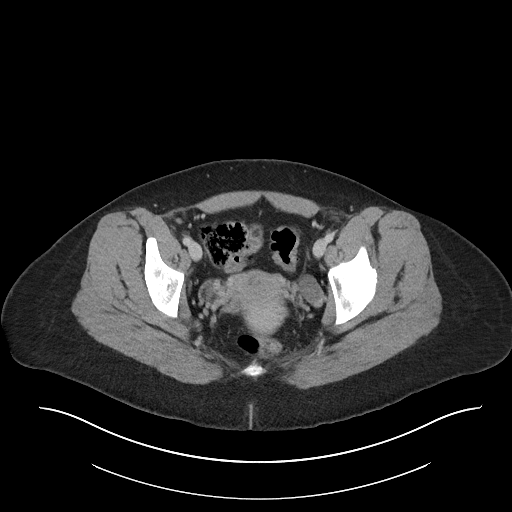
[im 32/103  soft-tissue]
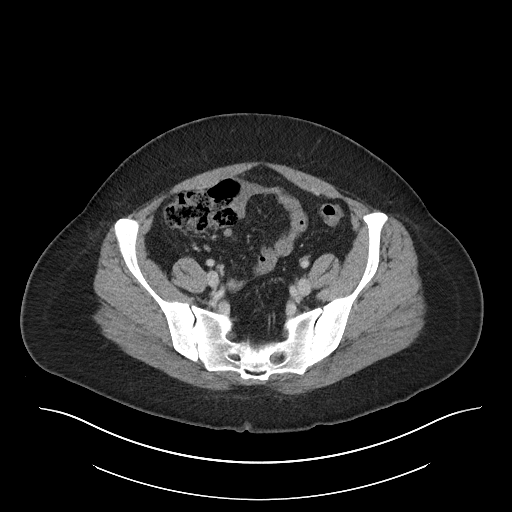
[im 39/103  soft-tissue]
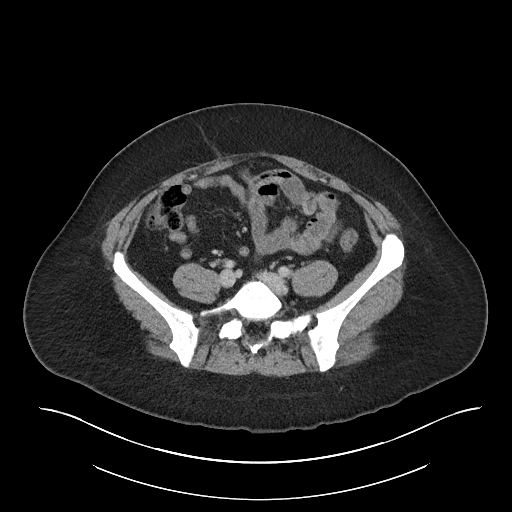
[im 45/103  soft-tissue]
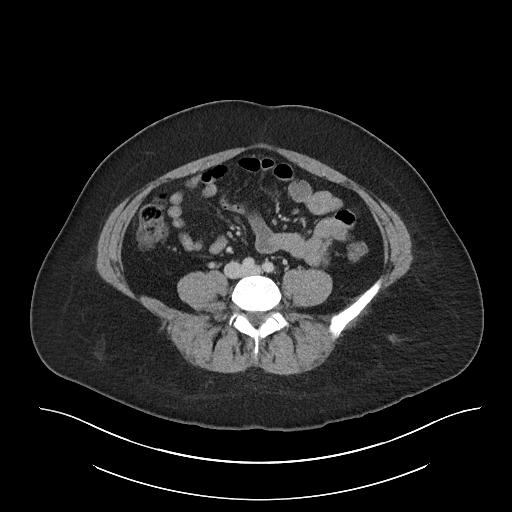
[im 52/103  soft-tissue]
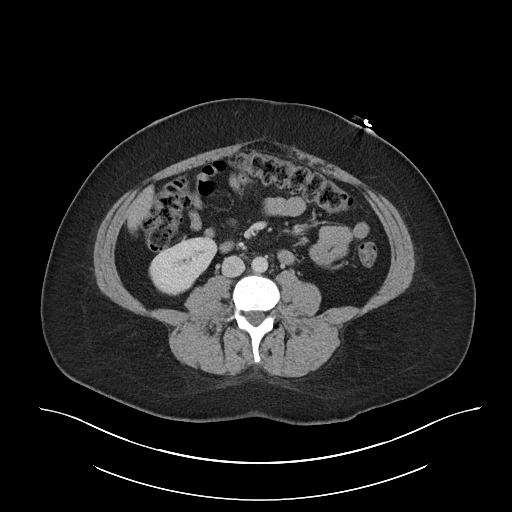
[im 58/103  soft-tissue]
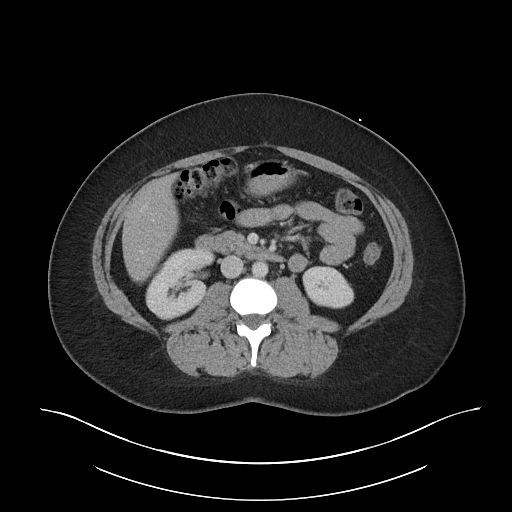
[im 64/103  soft-tissue]
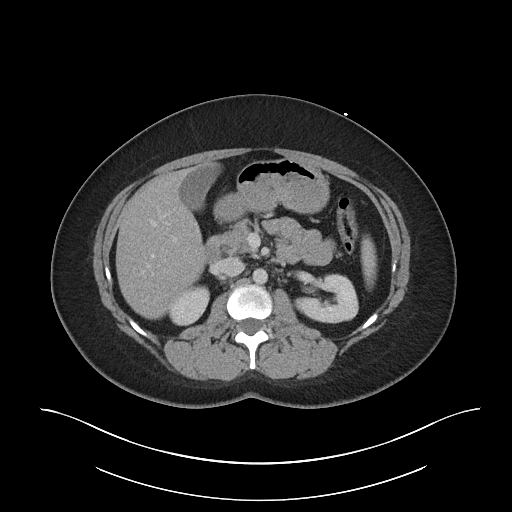
[im 64/103  bone]
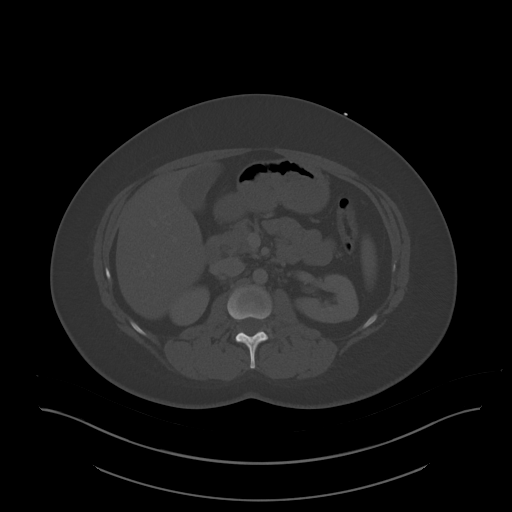
[im 71/103  soft-tissue]
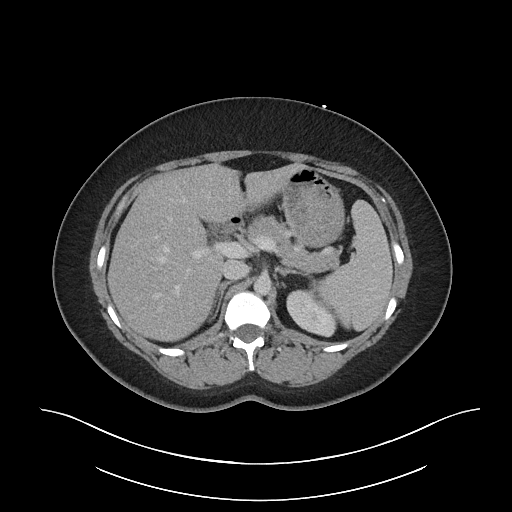
[im 83/103  soft-tissue]
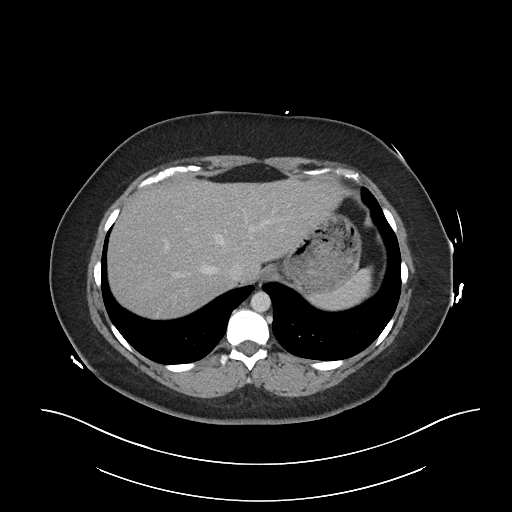
[im 90/103  soft-tissue]
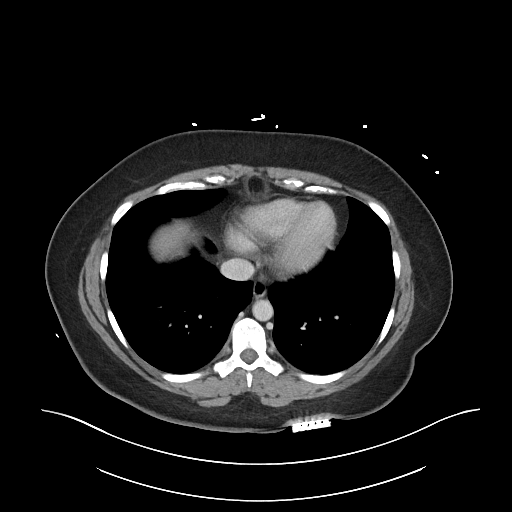
[im 96/103  soft-tissue]
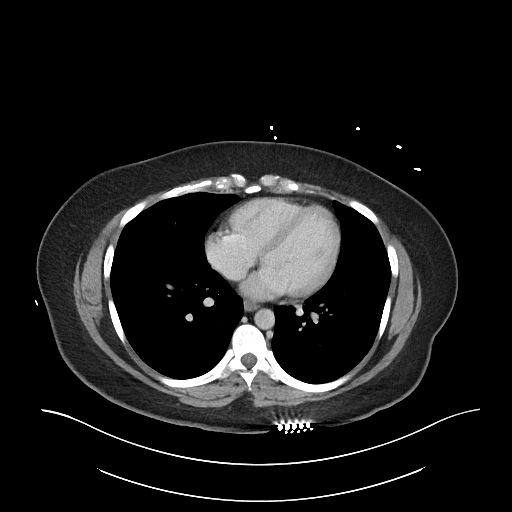

[Series 6: abdomen 3.0 mpr cor · coronal · 0.95mm/px · 3 of 111 slices shown]
[im 37/111  soft-tissue]
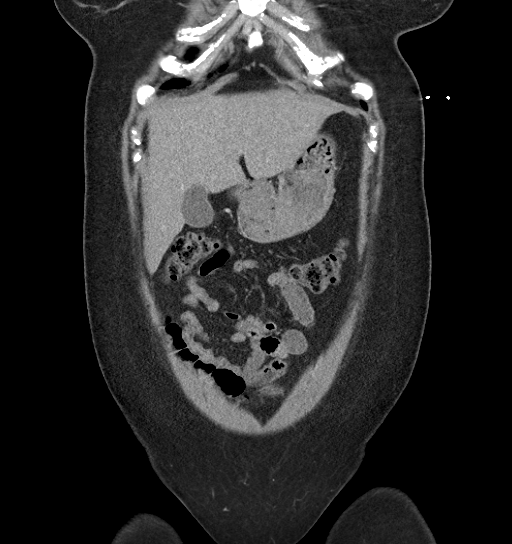
[im 49/111  soft-tissue]
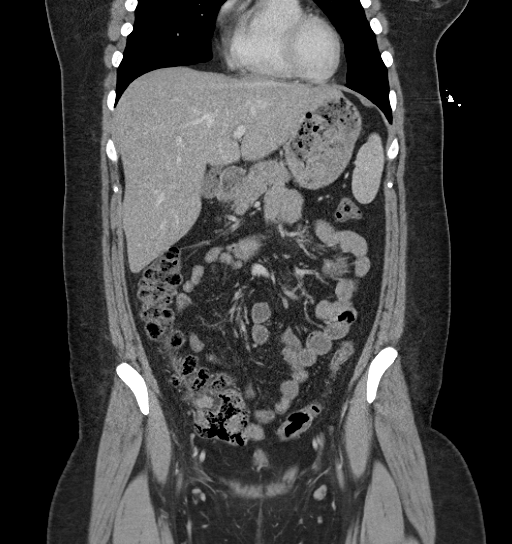
[im 62/111  soft-tissue]
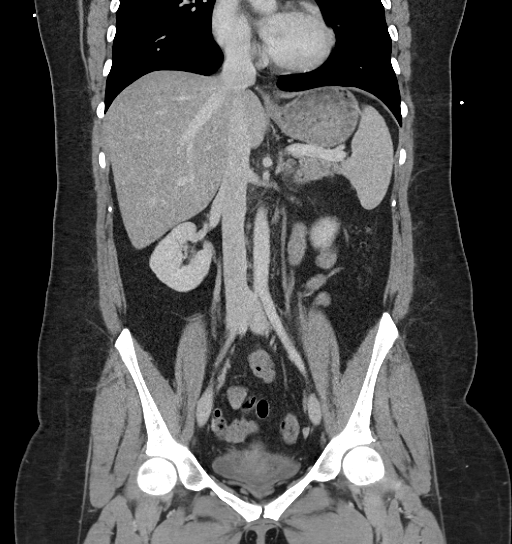

[16 of 46 positions shown; findings below may reference images not displayed]

FINDINGS: Lower chest: No acute abnormality.

Hepatobiliary: No focal liver abnormality is seen. No gallstones,
gallbladder wall thickening, or biliary dilatation.

Pancreas: Unremarkable. No pancreatic ductal dilatation or
surrounding inflammatory changes.

Spleen: Normal in size without focal abnormality.

Adrenals/Urinary Tract: Adrenal glands appear normal. Kidneys are
unremarkable without mass, stone or hydronephrosis. No perinephric
fluid. No ureteral or bladder calculi identified. Bladder is
decompressed.

Stomach/Bowel: No dilated large or small bowel loops. No evidence of
bowel wall inflammation. Appendix is partially obscured by
overlapping non-opacified bowel loops, but the visualized portion of
the appendix is not distended and there are no inflammatory changes
about the cecum to suggest acute appendicitis. Stomach is
unremarkable.

Vascular/Lymphatic: No significant vascular findings are present. No
enlarged abdominal or pelvic lymph nodes.

Reproductive: Uterus and bilateral adnexa are unremarkable.

Other: No free fluid or abscess collection. No free intraperitoneal
air.

Musculoskeletal: No acute or suspicious osseous finding. Small
periumbilical abdominal wall hernia which contains fat only.
IMPRESSION: No acute findings within the abdomen or pelvis. No bowel obstruction
or evidence of bowel wall inflammation. No free fluid. No renal or
ureteral calculi. No evidence of appendicitis.

## 2019-08-29 ENCOUNTER — Other Ambulatory Visit: Payer: Self-pay | Admitting: Family Medicine

## 2019-08-29 DIAGNOSIS — K529 Noninfective gastroenteritis and colitis, unspecified: Secondary | ICD-10-CM

## 2019-08-29 MED ORDER — CHOLESTYRAMINE LIGHT 4 G PO PACK: 4.0000 g | PACK | Freq: Two times a day (BID) | ORAL | 2 refills | Status: DC

## 2019-09-14 ENCOUNTER — Encounter: Payer: Self-pay | Admitting: Family Medicine

## 2019-09-16 ENCOUNTER — Other Ambulatory Visit: Payer: Self-pay | Admitting: Family Medicine

## 2019-09-16 MED ORDER — VENLAFAXINE HCL ER 75 MG PO CP24: 75.0000 mg | ORAL_CAPSULE | Freq: Every day | ORAL | 2 refills | Status: DC

## 2019-09-29 ENCOUNTER — Other Ambulatory Visit: Payer: Self-pay | Admitting: Family Medicine

## 2019-09-29 DIAGNOSIS — F418 Other specified anxiety disorders: Secondary | ICD-10-CM

## 2019-10-16 ENCOUNTER — Other Ambulatory Visit: Payer: Self-pay | Admitting: Family Medicine

## 2019-10-16 DIAGNOSIS — F418 Other specified anxiety disorders: Secondary | ICD-10-CM

## 2019-11-14 ENCOUNTER — Other Ambulatory Visit: Payer: Self-pay | Admitting: Gastroenterology

## 2019-11-14 DIAGNOSIS — R1033 Periumbilical pain: Secondary | ICD-10-CM

## 2019-11-24 ENCOUNTER — Other Ambulatory Visit: Payer: Self-pay | Admitting: Family Medicine

## 2019-11-24 DIAGNOSIS — F418 Other specified anxiety disorders: Secondary | ICD-10-CM

## 2019-11-30 ENCOUNTER — Telehealth (INDEPENDENT_AMBULATORY_CARE_PROVIDER_SITE_OTHER): Payer: 59 | Admitting: Family Medicine

## 2019-11-30 ENCOUNTER — Encounter: Payer: Self-pay | Admitting: Family Medicine

## 2019-11-30 ENCOUNTER — Other Ambulatory Visit: Payer: Self-pay

## 2019-11-30 DIAGNOSIS — F411 Generalized anxiety disorder: Secondary | ICD-10-CM

## 2019-11-30 MED ORDER — HYDROXYZINE HCL 25 MG PO TABS: 25.0000 mg | ORAL_TABLET | Freq: Three times a day (TID) | ORAL | 1 refills | Status: DC | PRN

## 2019-11-30 MED ORDER — VENLAFAXINE HCL ER 150 MG PO CP24: 150.0000 mg | ORAL_CAPSULE | Freq: Every day | ORAL | 3 refills | Status: DC

## 2019-11-30 NOTE — Progress Notes (Signed)
CC: Anxiety  Subjective Wanda Acosta presents for f/u anxiety. Due to COVID-19 pandemic, we are interacting via web portal for an electronic face-to-face visit. I verified patient's ID using 2 identifiers. Patient agreed to proceed with visit via this method. Patient is in a parked car, I am at office. Patient and I are present for visit.   She is currently being treated with Effexor XR 75 mg/d.  Reports good improvement since treatment, but over past 2 mo, has been having worsening anxiety with panic attacks 2-3 times per week.  There have seemingly been no triggers. Not much exercise.  No thoughts of harming self or others. No self-medication with alcohol, prescription drugs or illicit drugs. Pt is not following with a counselor/psychologist, but is on waiting list for 2 options.  Past Medical History:  Diagnosis Date  . Anxiety 12/10/2018   on lexapro  . Dysmenorrhea   . Exocrine pancreatic insufficiency 2020  . GERD (gastroesophageal reflux disease)   . No known health problems   . Obesity    Allergies as of 11/30/2019   No Known Allergies     Medication List       Accurate as of Nov 30, 2019  3:25 PM. If you have any questions, ask your nurse or doctor.        STOP taking these medications   traZODone 50 MG tablet Commonly known as: DESYREL Stopped by: Shelda Pal, DO     TAKE these medications   cholestyramine light 4 g packet Commonly known as: PREVALITE Take 1 packet (4 g total) by mouth 2 (two) times daily.   hydrOXYzine 25 MG tablet Commonly known as: ATARAX/VISTARIL Take 1-3 tablets (25-75 mg total) by mouth 3 (three) times daily as needed for anxiety. Started by: Shelda Pal, DO   lipase/protease/amylase 36000 UNITS Cpep capsule Commonly known as: CREON Take 4 capsules with each meal. Take 1 to 2 capsules with each snack.   mirtazapine 30 MG tablet Commonly known as: REMERON TAKE ONE TABLET BY MOUTH AT BEDTIME   Nexplanon 68 MG  Impl implant Generic drug: etonogestrel 1 each by Subdermal route once.   ondansetron 8 MG disintegrating tablet Commonly known as: Zofran ODT Take 1 tablet (8 mg total) by mouth every 8 (eight) hours as needed for nausea, vomiting or refractory nausea / vomiting.   pantoprazole 40 MG tablet Commonly known as: PROTONIX TAKE ONE TABLET BY MOUTH TWICE A DAY X 7 DAYS THEN TAKE ONE TABLET BY MOUTH DAILY   venlafaxine XR 150 MG 24 hr capsule Commonly known as: Effexor XR Take 1 capsule (150 mg total) by mouth daily with breakfast. What changed:   medication strength  how much to take Changed by: Shelda Pal, DO   Xyzal 5 MG tablet Generic drug: levocetirizine Take 5 mg by mouth as needed for allergies.       Exam No conversational dyspnea Age appropriate judgment and insight Nml affect and mood  Assessment and Plan  GAD (generalized anxiety disorder) - Plan: hydrOXYzine (ATARAX/VISTARIL) 25 MG tablet, venlafaxine XR (EFFEXOR XR) 150 MG 24 hr capsule  Status: Chronic, uncontrolled; increase Effexor from 75 mg/d to 150 mg/d. Add prn hydroxyzine. Encouraged to cont trying to get in w counseling. Counseled on exercise.  F/u in 1 mo. The patient voiced understanding and agreement to the plan.  [place], DO 11/30/19 3:25 PM

## 2020-01-04 ENCOUNTER — Other Ambulatory Visit: Payer: Self-pay

## 2020-01-04 ENCOUNTER — Telehealth: Payer: Self-pay

## 2020-01-04 DIAGNOSIS — R1033 Periumbilical pain: Secondary | ICD-10-CM

## 2020-01-04 DIAGNOSIS — F418 Other specified anxiety disorders: Secondary | ICD-10-CM

## 2020-01-04 MED ORDER — MIRTAZAPINE 30 MG PO TABS: 30.0000 mg | ORAL_TABLET | Freq: Every day | ORAL | 0 refills | Status: DC

## 2020-01-04 MED ORDER — PANTOPRAZOLE SODIUM 40 MG PO TBEC: 40.0000 mg | DELAYED_RELEASE_TABLET | Freq: Every day | ORAL | 1 refills | Status: DC

## 2020-01-04 NOTE — Telephone Encounter (Signed)
Refill done.  

## 2020-01-04 NOTE — Telephone Encounter (Signed)
Patient needs office visit.  

## 2020-01-13 IMAGING — DX DG CHEST 1V PORT
1 series · 1 of 1 positions shown · non-contrast
Comparison: No prior.

CLINICAL DATA: Fever.  Body aches.  Chills.

EXAM:
PORTABLE CHEST 1 VIEW

[chest ap]
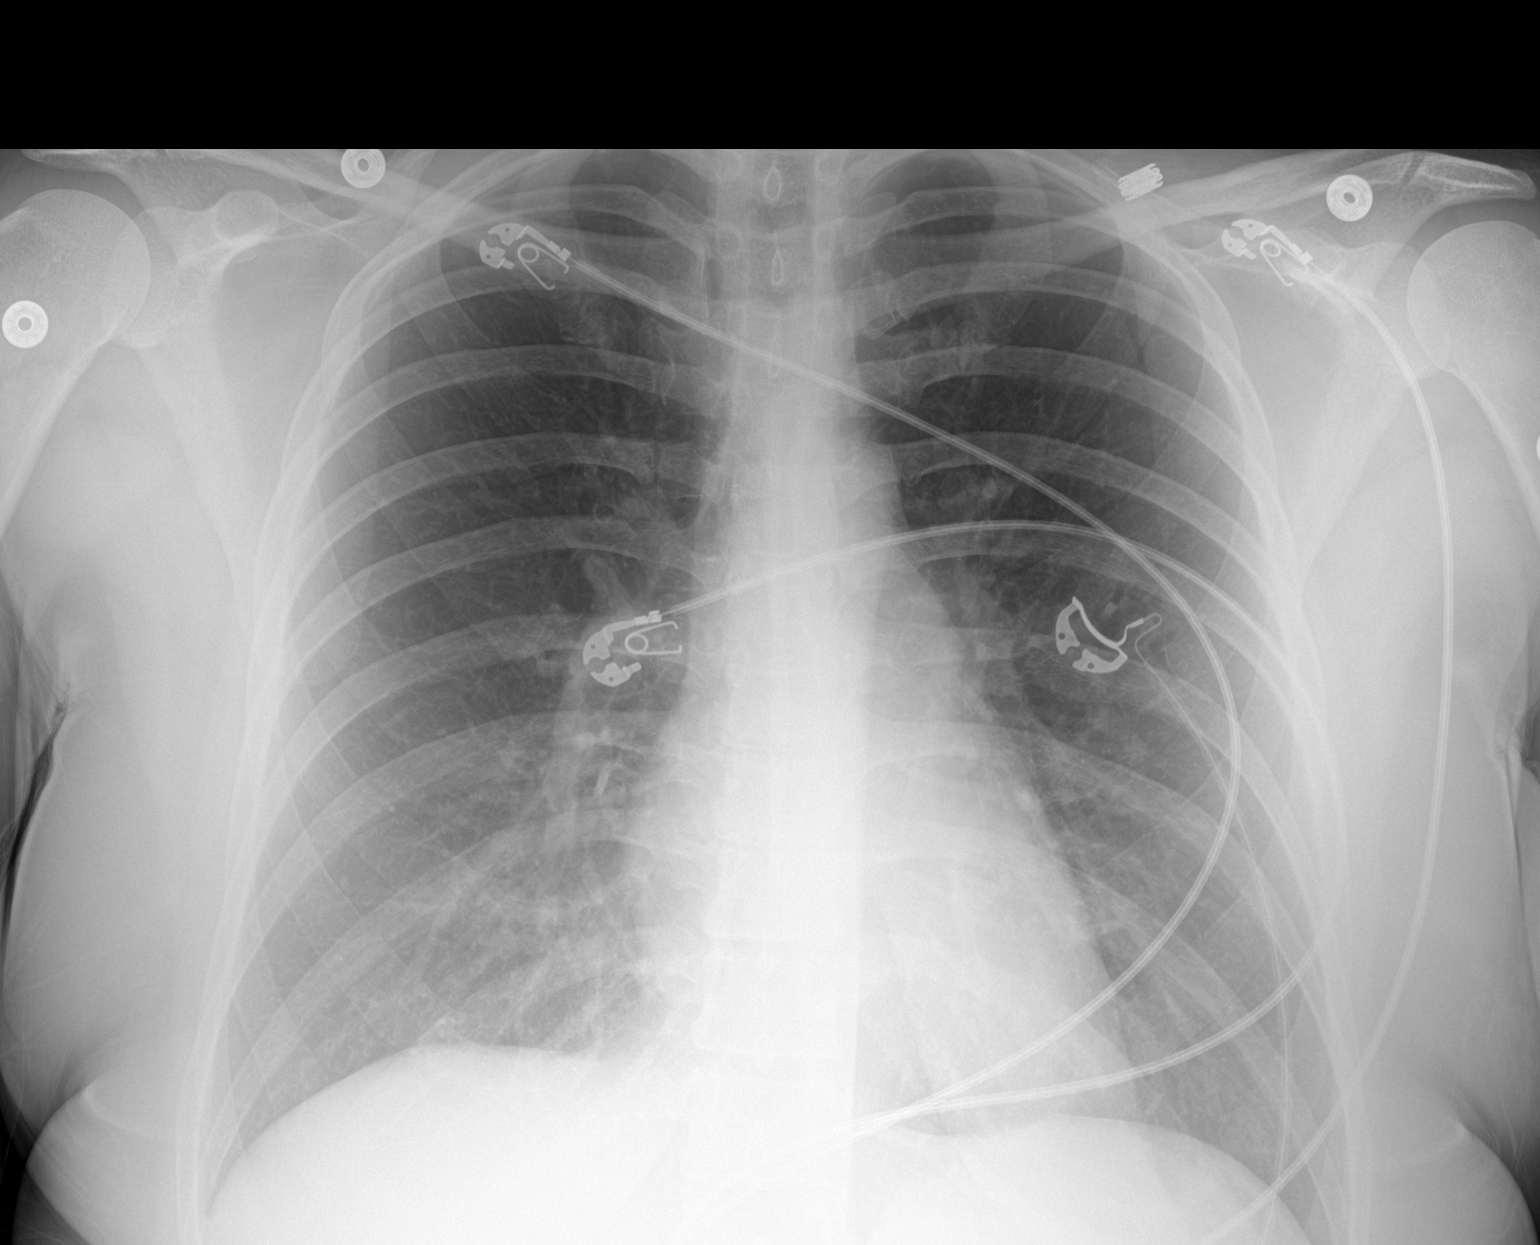

[1 of 1 positions shown; findings below may reference images not displayed]

FINDINGS: Mediastinum hilar structures normal. Lungs are clear of acute
infiltrates. Heart size normal. No pleural effusion or pneumothorax.
IMPRESSION: Stable chest.  No acute cardiopulmonary disease identified.

## 2020-01-19 ENCOUNTER — Other Ambulatory Visit: Payer: Self-pay

## 2020-01-19 ENCOUNTER — Ambulatory Visit (INDEPENDENT_AMBULATORY_CARE_PROVIDER_SITE_OTHER)
Admission: RE | Admit: 2020-01-19 | Discharge: 2020-01-19 | Disposition: A | Discharge status: Home / Self Care | Payer: 59 | Source: Ambulatory Visit

## 2020-01-19 DIAGNOSIS — R519 Headache, unspecified: Secondary | ICD-10-CM

## 2020-01-19 MED ORDER — IBUPROFEN 800 MG PO TABS
800.0000 mg | ORAL_TABLET | Freq: Three times a day (TID) | ORAL | 0 refills | Status: DC | PRN
Start: 2020-01-19 — End: 2020-03-20

## 2020-01-19 NOTE — ED Provider Notes (Signed)
Virtual Visit via Video Note:  Wanda Acosta  initiated request for Telemedicine visit with [facility] Urgent [facility] team. I connected with Wanda Acosta  on 01/19/2020 at 9:36 AM  for a synchronized telemedicine visit using a video enabled HIPPA compliant telemedicine application. I verified that I am speaking with Wanda Acosta  using two identifiers. Wanda Balloon, Wanda Acosta  was physically located in a [facility] Urgent care site and Wanda Acosta was located at a different location.   The limitations of evaluation and management by telemedicine as well as the availability of in-person appointments were discussed. Patient was informed that she  may incur a bill ( including co-pay) for this virtual visit encounter. Wanda Acosta  expressed understanding and gave verbal consent to proceed with virtual visit.     History of Present Illness:Wanda Acosta  is a 29 y.o. female presents for evaluation of 4 day history of headache.  Her headache is frontal, worse with standing and walking, improves with lying in dark room, worst 8/10, best 5/10.  She reports high stress currently.  Treatment attempted with Tylenol and Benadryl.  She denies fever, chills, focal weakness, facial asymmetry, congestion, rhinorrhea, ear pain, sore throat, cough, chest pain, SOB, abdominal pain, or other symptoms.  She reports a history of headaches; has not seen a neurologist.  She denies current pregnancy or breastfeeding.      No Known Allergies   Past Medical History:  Diagnosis Date  . Anxiety 12/10/2018   on lexapro  . Dysmenorrhea   . Exocrine pancreatic insufficiency 2020  . GERD (gastroesophageal reflux disease)   . No known health problems   . Obesity      Social History   Tobacco Use  . Smoking status: Never Smoker  . Smokeless tobacco: Never Used  Vaping Use  . Vaping Use: Never used  Substance Use Topics  . Alcohol use: Never  . Drug use: Never    ROS: as stated in HPI.  All other systems reviewed and negative.       Observations/Objective: Physical Exam  VITALS: Patient denies fever. GENERAL: Alert, appears well and in no acute distress. HEENT: Atraumatic. Oral mucosa appears moist. NECK: Normal movements of the head and neck. CARDIOPULMONARY: No increased WOB. Speaking in clear sentences. I:E ratio WNL.  MS: Moves all visible extremities without noticeable abnormality. PSYCH: Pleasant and cooperative, well-groomed. Speech normal rate and rhythm. Affect is appropriate. Insight and judgement are appropriate. Attention is focused, linear, and appropriate.  NEURO: CN grossly intact. Oriented as arrived to appointment on time with no prompting. Moves both UE equally.  SKIN: No obvious lesions, wounds, erythema, or cyanosis noted on face or hands.   Assessment and Plan:    ICD-10-CM   1. Acute nonintractable headache, unspecified headache type  R51.9        Follow Up Instructions: Treating with ibuprofen.  Instructed patient to follow-up with her PCP or come to the urgent care for in-person evaluation if her symptoms are not improving.  Patient agrees to plan of care.    I discussed the assessment and treatment plan with the patient. The patient was provided an opportunity to ask questions and all were answered. The patient agreed with the plan and demonstrated an understanding of the instructions.   The patient was advised to call back or seek an in-person evaluation if the symptoms worsen or if the condition fails to improve as anticipated.      Wanda Balloon, Wanda Acosta  01/19/2020  9:36 AM         Wanda Balloon, Wanda Acosta 01/19/20 [phone]

## 2020-01-19 NOTE — Discharge Instructions (Addendum)
Take the ibuprofen as directed.    Follow up with your primary care provider or come here to be seen in person if your symptoms are not improving.

## 2020-02-01 ENCOUNTER — Telehealth (INDEPENDENT_AMBULATORY_CARE_PROVIDER_SITE_OTHER): Payer: 59 | Admitting: Family Medicine

## 2020-02-01 ENCOUNTER — Other Ambulatory Visit: Payer: Self-pay

## 2020-02-01 ENCOUNTER — Encounter: Payer: Self-pay | Admitting: Family Medicine

## 2020-02-01 DIAGNOSIS — R234 Changes in skin texture: Secondary | ICD-10-CM

## 2020-02-01 DIAGNOSIS — R1033 Periumbilical pain: Secondary | ICD-10-CM | POA: Diagnosis not present

## 2020-02-01 DIAGNOSIS — R5383 Other fatigue: Secondary | ICD-10-CM

## 2020-02-01 DIAGNOSIS — R635 Abnormal weight gain: Secondary | ICD-10-CM

## 2020-02-01 DIAGNOSIS — R768 Other specified abnormal immunological findings in serum: Secondary | ICD-10-CM

## 2020-02-01 DIAGNOSIS — F418 Other specified anxiety disorders: Secondary | ICD-10-CM | POA: Diagnosis not present

## 2020-02-01 MED ORDER — GABAPENTIN 300 MG PO CAPS: ORAL_CAPSULE | ORAL | 3 refills | Status: DC

## 2020-02-01 NOTE — Progress Notes (Signed)
Chief Complaint  Patient presents with  . Follow-up    Subjective: Patient is a 29 y.o. female here for f/u. Due to COVID-19 pandemic, we are interacting via web portal for an electronic face-to-face visit. I verified patient's ID using 2 identifiers. Patient agreed to proceed with visit via this method. Patient is in a parked car, I am at office. Patient and I are present for visit.   Patient was seen at the [facility] gastroenterology clinic where she saw a pancreatic specialist.  They performed a series of lab work and a CT scan.  The CT scan was unremarkable.  Most of the lab work is unremarkable with exception of an ANA that was 1: 640.  She was told to follow-up with her PCP regarding these.  She has scant episodes of joint pain.  She has also gained weight despite having a poor appetite.  The patient has associated fatigue.  She notes having some scaly skin on the left lower extremity without known cause.  Her GI issues continue to worsen.  She is frustrated with the lack of answers.  Past Medical History:  Diagnosis Date  . Anxiety 12/10/2018   on lexapro  . Dysmenorrhea   . Exocrine pancreatic insufficiency 2020  . GERD (gastroesophageal reflux disease)   . Obesity     Objective: No conversational dyspnea Age appropriate judgment and insight Nml affect and mood  Assessment and Plan: Fatigue, unspecified type - Plan: Basic metabolic panel, TSH, T4, free, Hemoglobin A1c, VITAMIN D 25 Hydroxy (Vit-D Deficiency, Fractures), Cortisol  Periumbilical abdominal pain  Situational anxiety - Plan: gabapentin (NEURONTIN) 300 MG capsule  Elevated antinuclear antibody (ANA) level - Plan: Ambulatory referral to Rheumatology  Weight gain  Scaly skin  Challenging case. Will ck labs. Cont w Creon and diet as rec'd by GI. Add gabapentin prn. May need consistently as a central modulator if functional issue more likely. Doubt structural or malignancy given normal scope/scans.  I don't think the  ANA likely has any role in this, but will refer to rheum for their opinion. I don't think this is urgent or emergent. F/u pending above.  The patient voiced understanding and agreement to the plan.  [place], DO 02/01/20  11:57 AM

## 2020-02-10 NOTE — Addendum Note (Signed)
Addended by: Sharon Seller B on: 02/10/2020 08:28 AM   Modules accepted: Orders

## 2020-02-11 LAB — BASIC METABOLIC PANEL
BUN/Creatinine Ratio: 15 (ref 9–23)
BUN: 13 mg/dL (ref 6–20)
CO2: 22 mmol/L (ref 20–29)
Calcium: 9.5 mg/dL (ref 8.7–10.2)
Chloride: 103 mmol/L (ref 96–106)
Creatinine, Ser: 0.89 mg/dL (ref 0.57–1.00)
GFR calc Af Amer: 100 mL/min/{1.73_m2} (ref >59)
GFR calc non Af Amer: 87 mL/min/{1.73_m2} (ref >59)
Glucose: 81 mg/dL (ref 65–99)
Potassium: 4.9 mmol/L (ref 3.5–5.2)
Sodium: 138 mmol/L (ref 134–144)

## 2020-02-11 LAB — CORTISOL: Cortisol: 6.5 ug/dL

## 2020-02-11 LAB — HEMOGLOBIN A1C
Est. average glucose Bld gHb Est-mCnc: 91 mg/dL
Hgb A1c MFr Bld: 4.8 % (ref 4.8–5.6)

## 2020-02-11 LAB — VITAMIN D 25 HYDROXY (VIT D DEFICIENCY, FRACTURES): Vit D, 25-Hydroxy: 25.4 ng/mL — ABNORMAL LOW (ref 30.0–100.0)

## 2020-02-11 LAB — T4, FREE: Free T4: 1.08 ng/dL (ref 0.82–1.77)

## 2020-02-11 LAB — TSH: TSH: 1 u[IU]/mL (ref 0.450–4.500)

## 2020-02-23 ENCOUNTER — Other Ambulatory Visit: Payer: Self-pay

## 2020-02-23 ENCOUNTER — Encounter: Payer: Self-pay | Admitting: Emergency Medicine

## 2020-02-23 ENCOUNTER — Ambulatory Visit
Admission: EM | Admit: 2020-02-23 | Discharge: 2020-02-23 | Disposition: A | Discharge status: Home / Self Care | Payer: 59 | Attending: Family Medicine | Admitting: Family Medicine

## 2020-02-23 DIAGNOSIS — Z79899 Other long term (current) drug therapy: Secondary | ICD-10-CM | POA: Insufficient documentation

## 2020-02-23 DIAGNOSIS — H6592 Unspecified nonsuppurative otitis media, left ear: Secondary | ICD-10-CM

## 2020-02-23 DIAGNOSIS — H6691 Otitis media, unspecified, right ear: Secondary | ICD-10-CM | POA: Diagnosis not present

## 2020-02-23 DIAGNOSIS — Z791 Long term (current) use of non-steroidal anti-inflammatories (NSAID): Secondary | ICD-10-CM | POA: Insufficient documentation

## 2020-02-23 DIAGNOSIS — Z975 Presence of (intrauterine) contraceptive device: Secondary | ICD-10-CM | POA: Insufficient documentation

## 2020-02-23 DIAGNOSIS — K8681 Exocrine pancreatic insufficiency: Secondary | ICD-10-CM | POA: Insufficient documentation

## 2020-02-23 DIAGNOSIS — B349 Viral infection, unspecified: Secondary | ICD-10-CM | POA: Insufficient documentation

## 2020-02-23 DIAGNOSIS — K219 Gastro-esophageal reflux disease without esophagitis: Secondary | ICD-10-CM | POA: Insufficient documentation

## 2020-02-23 DIAGNOSIS — Z20822 Contact with and (suspected) exposure to covid-19: Secondary | ICD-10-CM | POA: Insufficient documentation

## 2020-02-23 DIAGNOSIS — E669 Obesity, unspecified: Secondary | ICD-10-CM | POA: Insufficient documentation

## 2020-02-23 DIAGNOSIS — F419 Anxiety disorder, unspecified: Secondary | ICD-10-CM | POA: Insufficient documentation

## 2020-02-23 MED ORDER — FLUCONAZOLE 150 MG PO TABS
150.0000 mg | ORAL_TABLET | Freq: Every day | ORAL | 0 refills | Status: DC
Start: 2020-02-23 — End: 2020-03-09

## 2020-02-23 MED ORDER — AMOXICILLIN 875 MG PO TABS
875.0000 mg | ORAL_TABLET | Freq: Two times a day (BID) | ORAL | 0 refills | Status: DC
Start: 2020-02-23 — End: 2020-03-09

## 2020-02-23 NOTE — Discharge Instructions (Addendum)
Take medication as prescribed. Rest. Drink plenty of fluids. Over the counter cough congestion medication, Sudafed as needed.  Probiotics.  Rest.  Drink plenty fluids.  Follow up with your primary care physician this week as needed. Return to Urgent care for new or worsening concerns.

## 2020-02-23 NOTE — ED Triage Notes (Signed)
Pt c/o nasal congestion, diarrhea, headache, dizziness. Started about a week and half ago. She states she had a rapid covid and it was negative.

## 2020-02-23 NOTE — ED Provider Notes (Signed)
[facility] ____________________________________________  Time seen: Approximately 7:17 PM  I have reviewed the triage vital signs and the nursing notes.   HISTORY  Chief Complaint Headache, Diarrhea, and Nasal Congestion   HPI Wanda Acosta is a 29 y.o. female presenting for evaluation of nasal congestion, some cough, ear congestion, diarrhea.  Patient reports this started last week, then symptoms improved and then returned this week.  States left ear discomfort more than right.  Occasional scratchy throat.  Occasional nausea.  Decreased appetite.  Some intermittent dizziness with movement.  No vomiting.  Diarrhea multiple episodes today, reports she has chronic diarrhea but this is been more than normal.  Denies any blood in diarrhea.  Denies abdominal pain atypically.  Occasional cough.  Denies chest pain or shortness of breath.  Patient is vaccinated against COVID-19.  Denies known sick contacts.  Denies food triggers.  Unresolved with over-the-counter medications.  Denies recent antibiotic use.  Reports she did do a rapid Covid last week which was negative.  No LMP recorded. (Menstrual status: Irregular Periods).  Shelda Pal, DO : PCP    Past Medical History:  Diagnosis Date  . Anxiety 12/10/2018   on lexapro  . Dysmenorrhea   . Exocrine pancreatic insufficiency 2020  . GERD (gastroesophageal reflux disease)   . Obesity     Patient Active Problem List   Diagnosis Date Noted  . Situational anxiety 04/27/2019  . Abdominal cramping 03/12/2019  . Diarrhea 03/12/2019  . Other fatigue 03/12/2019  . Chronic diarrhea 02/03/2019  . Generalized abdominal pain 02/03/2019  . Rectal bleeding 02/03/2019  . Hemorrhoids 02/03/2019  . Exocrine pancreatic insufficiency 01/27/2019  . Change in bowel habits 12/03/2018  . Nausea without vomiting 12/03/2018  . Periumbilical abdominal pain 11/10/2018  . Non-intractable vomiting 11/10/2018  . Gastroesophageal reflux  disease 05/17/2018  . Environmental allergies 05/17/2018  . Nexplanon insertion 04/01/2017    Past Surgical History:  Procedure Laterality Date  . OTHER SURGICAL HISTORY     Finger reattachment     No current facility-administered medications for this encounter.  Current Outpatient Medications:  .  etonogestrel (NEXPLANON) 68 MG IMPL implant, 1 each by Subdermal route once., Disp: , Rfl:  .  gabapentin (NEURONTIN) 300 MG capsule, Take 1-2 caps every 8 hours as needed for anxiety., Disp: 90 capsule, Rfl: 3 .  levocetirizine (XYZAL) 5 MG tablet, Take 5 mg by mouth as needed for allergies., Disp: , Rfl:  .  lipase/protease/amylase (CREON) 36000 UNITS CPEP capsule, Take 4 capsules with each meal. Take 1 to 2 capsules with each snack., Disp: 340 capsule, Rfl: 4 .  mirtazapine (REMERON) 30 MG tablet, Take 1 tablet (30 mg total) by mouth at bedtime., Disp: 30 tablet, Rfl: 0 .  ondansetron (ZOFRAN ODT) 8 MG disintegrating tablet, Take 1 tablet (8 mg total) by mouth every 8 (eight) hours as needed for nausea, vomiting or refractory nausea / vomiting., Disp: 30 tablet, Rfl: 3 .  pantoprazole (PROTONIX) 40 MG tablet, Take 1 tablet (40 mg total) by mouth daily., Disp: 30 tablet, Rfl: 1 .  venlafaxine XR (EFFEXOR XR) 150 MG 24 hr capsule, Take 1 capsule (150 mg total) by mouth daily with breakfast., Disp: 30 capsule, Rfl: 3 .  amoxicillin (AMOXIL) 875 MG tablet, Take 1 tablet (875 mg total) by mouth 2 (two) times daily., Disp: 20 tablet, Rfl: 0 .  cholestyramine light (PREVALITE) 4 g packet, Take 1 packet (4 g total) by mouth 2 (two) times daily., Disp: 60  each, Rfl: 2 .  fluconazole (DIFLUCAN) 150 MG tablet, Take 1 tablet (150 mg total) by mouth daily. Take one pill orally as needed, Disp: 1 tablet, Rfl: 0 .  ibuprofen (ADVIL) 800 MG tablet, Take 1 tablet (800 mg total) by mouth every 8 (eight) hours as needed., Disp: 21 tablet, Rfl: 0  Allergies Patient has no known allergies.  Family History    Problem Relation Age of Onset  . Hypertension Father   . Diabetes Maternal Grandfather   . Colon cancer Paternal Grandfather 12  . Esophageal cancer Neg Hx   . Inflammatory bowel disease Neg Hx   . Liver disease Neg Hx   . Pancreatic cancer Neg Hx   . Stomach cancer Neg Hx     Social History Social History   Tobacco Use  . Smoking status: Never Smoker  . Smokeless tobacco: Never Used  Vaping Use  . Vaping Use: Every day  Substance Use Topics  . Alcohol use: Never  . Drug use: Never    Review of Systems Constitutional: Reports she had a fever last week at symptom onset, no fever this week. ENT: As above.  Cardiovascular: Denies chest pain. Respiratory: Denies shortness of breath. Gastrointestinal: AS above.  Genitourinary: Negative for dysuria. Musculoskeletal: Negative for back pain. Skin: Negative for rash.   ____________________________________________   PHYSICAL EXAM:  VITAL SIGNS: ED Triage Vitals  Enc Vitals Group     BP 02/23/20 1807 (!) 131/96     Pulse Rate 02/23/20 1807 (!) 110     Resp 02/23/20 1807 18     Temp 02/23/20 1807 98.6 F (37 C)     Temp Source 02/23/20 1807 Oral     SpO2 02/23/20 1807 100 %     Weight 02/23/20 1804 196 lb 3.4 oz (89 kg)     Height 02/23/20 1804 5\' 8"  (1.727 m)     Head Circumference --      Peak Flow --      Pain Score 02/23/20 1804 3     Pain Loc --      Pain Edu? --      Excl. in [place]? --    Constitutional: Alert and oriented. Well appearing and in no acute distress. Eyes: Conjunctivae are normal.  Head: Atraumatic.  No frontal sinus tenderness palpation.  Mild bilateral maxillary sinus tenderness palpation.  No swelling. No erythema.  Ears: Left: Nontender, normal canal, effusion present, no erythema, otherwise normal TM.  Right: Nontender, normal canal, moderate erythema dull TM.  Nose:Nasal congestion   Mouth/Throat: Mucous membranes are moist. No pharyngeal erythema. No tonsillar swelling or exudate.  Neck:  No stridor.  No cervical spine tenderness to palpation. Hematological/Lymphatic/Immunilogical: No cervical lymphadenopathy. Cardiovascular: Normal rate, regular rhythm. Grossly normal heart sounds.  Good peripheral circulation. Respiratory: Normal respiratory effort.  No retractions. No wheezes, rales or rhonchi. Good air movement.  Gastrointestinal: Normal bowel sounds.  Minimal diffuse tenderness.  No guarding.  No point tenderness. Musculoskeletal: Ambulatory with steady gait.  Neurologic:  Normal speech and language. No gait instability. Skin:  Skin appears warm, dry and intact. No rash noted. Psychiatric: Mood and affect are normal. Speech and behavior are normal.  ___________________________________________   LABS (all labs ordered are listed, but only abnormal results are displayed)  Labs Reviewed  SARS CORONAVIRUS 2 (TAT 6-24 HRS)   ____________________________________________   PROCEDURES Procedures     INITIAL IMPRESSION / ASSESSMENT AND PLAN / ED COURSE  Pertinent labs & imaging results that  were available during my care of the patient were reviewed by me and considered in my medical decision making (see chart for details).  Overall well-appearing patient.  No acute distress.  Suspect viral illness.  Otitis media noted, mild sinusitis.  Will treat with oral amoxicillin.  Over-the-counter cough congestion medication, Sudafed, probiotics.  Supportive care.Discussed indication, risks and benefits of medications with patient.  Awaiting COVID-19 testing  Discussed follow up with Primary care physician this week. Discussed follow up and return parameters including no resolution or any worsening concerns. Patient verbalized understanding and agreed to plan.   ____________________________________________   FINAL CLINICAL IMPRESSION(S) / ED DIAGNOSES  Final diagnoses:  Right otitis media, unspecified otitis media type  Middle ear effusion, left  Viral illness     ED  Discharge Orders         Ordered    amoxicillin (AMOXIL) 875 MG tablet  2 times daily     Discontinue  Reprint     02/23/20 1852    fluconazole (DIFLUCAN) 150 MG tablet  Daily     Discontinue  Reprint     02/23/20 1903           Note: This dictation was prepared with Dragon dictation along with smaller phrase technology. Any transcriptional errors that result from this process are unintentional.         Marylene Land, NP 02/23/20 1925

## 2020-02-24 LAB — SARS CORONAVIRUS 2 (TAT 6-24 HRS): SARS Coronavirus 2: NEGATIVE

## 2020-02-25 ENCOUNTER — Other Ambulatory Visit: Payer: Self-pay | Admitting: Family Medicine

## 2020-02-25 DIAGNOSIS — F418 Other specified anxiety disorders: Secondary | ICD-10-CM

## 2020-02-28 ENCOUNTER — Other Ambulatory Visit: Payer: Self-pay

## 2020-02-28 ENCOUNTER — Ambulatory Visit
Admission: EM | Admit: 2020-02-28 | Discharge: 2020-02-28 | Disposition: A | Discharge status: Home / Self Care | Payer: PRIVATE HEALTH INSURANCE | Attending: Family Medicine | Admitting: Family Medicine

## 2020-02-28 ENCOUNTER — Encounter: Payer: Self-pay | Admitting: Emergency Medicine

## 2020-02-28 ENCOUNTER — Telehealth: Payer: Self-pay | Admitting: Family Medicine

## 2020-02-28 DIAGNOSIS — H6983 Other specified disorders of Eustachian tube, bilateral: Secondary | ICD-10-CM

## 2020-02-28 MED ORDER — IPRATROPIUM BROMIDE 0.06 % NA SOLN: 2.0000 | Freq: Four times a day (QID) | NASAL | 0 refills | Status: DC | PRN

## 2020-02-28 NOTE — Telephone Encounter (Signed)
New message:   Pt states she went to urgent care due to having some ear pain. Pt was diagnosed with a ear infection and sinus infection. She states she has been taking an antibiotic for over a week and her ear still isn't better. She states she has a lot of drainage. She wants to know is this normal or should she be checked out again. Please advise.

## 2020-02-28 NOTE — Telephone Encounter (Signed)
Not normal, should be seen. Ty.

## 2020-02-28 NOTE — Discharge Instructions (Signed)
Medication as prescribed.  Continue Sudafed.  If persists, see [facility].  Take care  Dr. Lacinda Axon

## 2020-02-28 NOTE — Telephone Encounter (Signed)
Called informed of PCP instructions. She agreed to be seen but will need to return back to the UC she originally went to due to her work schedule/location.

## 2020-02-28 NOTE — ED Provider Notes (Signed)
[facility]    CSN: [id number] Arrival date & time: 02/28/20  1807  History   Chief Complaint Chief Complaint  Patient presents with  . Otalgia   HPI  29 year old female presents with otalgia.  Patient reports bilateral ear pain for the past week.  She reports that it was particular bad last night and her right ear was draining.  She was recently seen here on 7/29 and was treated for otitis media.  She is still on antibiotics at this time.  Pain currently 4/10 in severity.  Afebrile.  No other associated symptoms.  No other complaints.  Past Medical History:  Diagnosis Date  . Anxiety 12/10/2018   on lexapro  . Dysmenorrhea   . Exocrine pancreatic insufficiency 2020  . GERD (gastroesophageal reflux disease)   . Obesity     Patient Active Problem List   Diagnosis Date Noted  . Situational anxiety 04/27/2019  . Abdominal cramping 03/12/2019  . Diarrhea 03/12/2019  . Other fatigue 03/12/2019  . Chronic diarrhea 02/03/2019  . Generalized abdominal pain 02/03/2019  . Rectal bleeding 02/03/2019  . Hemorrhoids 02/03/2019  . Exocrine pancreatic insufficiency 01/27/2019  . Change in bowel habits 12/03/2018  . Nausea without vomiting 12/03/2018  . Periumbilical abdominal pain 11/10/2018  . Non-intractable vomiting 11/10/2018  . Gastroesophageal reflux disease 05/17/2018  . Environmental allergies 05/17/2018  . Nexplanon insertion 04/01/2017    Past Surgical History:  Procedure Laterality Date  . OTHER SURGICAL HISTORY     Finger reattachment    OB History    Gravida  1   Para  1   Term      Preterm      AB      Living  1     SAB      TAB      Ectopic      Multiple      Live Births  1            Home Medications    Prior to Admission medications   Medication Sig Start Date End Date Taking? Authorizing Provider  amoxicillin (AMOXIL) 875 MG tablet Take 1 tablet (875 mg total) by mouth 2 (two) times daily. 02/23/20  Yes Marylene Land, NP  cholestyramine light (PREVALITE) 4 g packet Take 1 packet (4 g total) by mouth 2 (two) times daily. 08/29/19  Yes Shelda Pal, DO  etonogestrel (NEXPLANON) 68 MG IMPL implant 1 each by Subdermal route once.   Yes [provider]  fluconazole (DIFLUCAN) 150 MG tablet Take 1 tablet (150 mg total) by mouth daily. Take one pill orally as needed 02/23/20  Yes Marylene Land, NP  gabapentin (NEURONTIN) 300 MG capsule Take 1-2 caps every 8 hours as needed for anxiety. 02/01/20  Yes Shelda Pal, DO  ibuprofen (ADVIL) 800 MG tablet Take 1 tablet (800 mg total) by mouth every 8 (eight) hours as needed. 01/19/20  Yes Sharion Balloon, NP  lipase/protease/amylase (CREON) 36000 UNITS CPEP capsule Take 4 capsules with each meal. Take 1 to 2 capsules with each snack. 05/17/19  Yes Mansouraty, Telford Nab., MD  mirtazapine (REMERON) 30 MG tablet TAKE 1 TABLET BY MOUTH AT BEDTIME. 02/27/20  Yes Wendling, Crosby Oyster, DO  ondansetron (ZOFRAN ODT) 8 MG disintegrating tablet Take 1 tablet (8 mg total) by mouth every 8 (eight) hours as needed for nausea, vomiting or refractory nausea / vomiting. 05/31/19  Yes Shelda Pal, DO  pantoprazole (PROTONIX) 40 MG tablet Take  1 tablet (40 mg total) by mouth daily. 01/04/20  Yes Mansouraty, Telford Nab., MD  venlafaxine XR (EFFEXOR XR) 150 MG 24 hr capsule Take 1 capsule (150 mg total) by mouth daily with breakfast. 11/30/19  Yes Wendling, Crosby Oyster, DO  ipratropium (ATROVENT) 0.06 % nasal spray Place 2 sprays into both nostrils 4 (four) times daily as needed for rhinitis. 02/28/20   Coral Spikes, DO  levocetirizine (XYZAL) 5 MG tablet Take 5 mg by mouth as needed for allergies.    [provider]    Family History Family History  Problem Relation Age of Onset  . Hypertension Father   . Diabetes Maternal Grandfather   . Colon cancer Paternal Grandfather 28  . Esophageal cancer Neg Hx   . Inflammatory bowel disease Neg Hx    . Liver disease Neg Hx   . Pancreatic cancer Neg Hx   . Stomach cancer Neg Hx     Social History Social History   Tobacco Use  . Smoking status: Never Smoker  . Smokeless tobacco: Never Used  Vaping Use  . Vaping Use: Every day  Substance Use Topics  . Alcohol use: Never  . Drug use: Never     Allergies   Patient has no known allergies.   Review of Systems Review of Systems  Constitutional: Negative.   HENT: Positive for ear pain.    Physical Exam Triage Vital Signs ED Triage Vitals  Enc Vitals Group     BP 02/28/20 1841 (!) 145/78     Pulse Rate 02/28/20 1841 98     Resp 02/28/20 1841 18     Temp 02/28/20 1841 99.4 F (37.4 C)     Temp Source 02/28/20 1841 Oral     SpO2 02/28/20 1841 100 %     Weight 02/28/20 1841 225 lb (102.1 kg)     Height 02/28/20 1841 5\' 8"  (1.727 m)     Head Circumference --      Peak Flow --      Pain Score 02/28/20 1840 4     Pain Loc --      Pain Edu? --      Excl. in [place]? --    Updated Vital Signs BP (!) 145/78 (BP Location: Right Arm)   Pulse 98   Temp 99.4 F (37.4 C) (Oral)   Resp 18   Ht 5\' 8"  (1.727 m)   Wt 102.1 kg   SpO2 100%   BMI 34.21 kg/m   Visual Acuity Right Eye Distance:   Left Eye Distance:   Bilateral Distance:    Right Eye Near:   Left Eye Near:    Bilateral Near:     Physical Exam Constitutional:      General: She is not in acute distress.    Appearance: Normal appearance. She is not ill-appearing.  HENT:     Head: Normocephalic and atraumatic.     Right Ear: Tympanic membrane normal.     Left Ear: Tympanic membrane normal.  Cardiovascular:     Rate and Rhythm: Normal rate and regular rhythm.     Heart sounds: No murmur heard.   Pulmonary:     Effort: Pulmonary effort is normal.     Breath sounds: Normal breath sounds. No wheezing, rhonchi or rales.  Neurological:     Mental Status: She is alert.  Psychiatric:        Mood and Affect: Mood normal.        Behavior: Behavior normal.  UC Treatments / Results  Labs (all labs ordered are listed, but only abnormal results are displayed) Labs Reviewed - No data to display  EKG   Radiology No results found.  Procedures Procedures (including critical care time)  Medications Ordered in UC Medications - No data to display  Initial Impression / Assessment and Plan / UC Course  I have reviewed the triage vital signs and the nursing notes.  Pertinent labs & imaging results that were available during my care of the patient were reviewed by me and considered in my medical decision making (see chart for details).    29 year old female presents with eustachian tube dysfunction.  Finish antibiotic treatment as previously prescribed.  Atrovent nasal spray.  Sudafed.  Supportive care.  Final Clinical Impressions(s) / UC Diagnoses   Final diagnoses:  Dysfunction of both eustachian tubes     Discharge Instructions     Medication as prescribed.  Continue Sudafed.  If persists, see [facility].  Take care  Dr. Lacinda Axon    ED Prescriptions    Medication Sig Dispense Auth. Provider   ipratropium (ATROVENT) 0.06 % nasal spray Place 2 sprays into both nostrils 4 (four) times daily as needed for rhinitis. 15 mL Coral Spikes, DO     PDMP not reviewed this encounter.   Coral Spikes, [place] 02/28/20 2135

## 2020-02-28 NOTE — ED Triage Notes (Signed)
Patient c/o bilateral ear pain that started about 1 week ago. She states her right ear was draining last night.

## 2020-03-08 ENCOUNTER — Other Ambulatory Visit: Payer: Self-pay | Admitting: Family Medicine

## 2020-03-09 ENCOUNTER — Other Ambulatory Visit: Payer: Self-pay

## 2020-03-09 ENCOUNTER — Telehealth (INDEPENDENT_AMBULATORY_CARE_PROVIDER_SITE_OTHER): Payer: Self-pay | Admitting: Family Medicine

## 2020-03-09 ENCOUNTER — Other Ambulatory Visit: Payer: Self-pay | Admitting: Gastroenterology

## 2020-03-09 ENCOUNTER — Encounter: Payer: Self-pay | Admitting: Family Medicine

## 2020-03-09 DIAGNOSIS — R1033 Periumbilical pain: Secondary | ICD-10-CM

## 2020-03-09 DIAGNOSIS — F418 Other specified anxiety disorders: Secondary | ICD-10-CM

## 2020-03-09 DIAGNOSIS — T50905A Adverse effect of unspecified drugs, medicaments and biological substances, initial encounter: Secondary | ICD-10-CM

## 2020-03-09 MED ORDER — CLONAZEPAM 0.5 MG PO TABS: 0.5000 mg | ORAL_TABLET | Freq: Two times a day (BID) | ORAL | 0 refills | Status: DC | PRN

## 2020-03-09 NOTE — Progress Notes (Signed)
Chief Complaint  Patient presents with  . Follow-up    medications    Subjective: Patient is a 29 y.o. female here for fu. Due to COVID-19 pandemic, we are interacting via web portal for an electronic face-to-face visit. I verified patient's ID using 2 identifiers. Patient agreed to proceed with visit via this method. Patient is in parked car, I am at office. Patient and I are present for visit.   Patient has been using as needed gabapentin for panic attacks/anxiety.  It has been working well.  Unfortunately, she started having night terrors again.  She is interested in seeing if we can do anything else.  She has failed hydroxyzine and BuSpar.  She is compliant with venlafaxine.  Past Medical History:  Diagnosis Date  . Anxiety 12/10/2018   on lexapro  . Dysmenorrhea   . Exocrine pancreatic insufficiency 2020  . GERD (gastroesophageal reflux disease)   . Obesity     Objective: No conversational dyspnea Age appropriate judgment and insight Nml affect and mood  Assessment and Plan: Situational anxiety - Plan: clonazePAM (KLONOPIN) 0.5 MG tablet  Adverse effect of drug, initial encounter  Add Klonopin, continue venlafaxine.  Stop gabapentin. I will see her in 1 month to recheck. The patient voiced understanding and agreement to the plan.  [place], DO 03/09/20  1:27 PM

## 2020-03-12 ENCOUNTER — Telehealth: Payer: Self-pay | Admitting: Gastroenterology

## 2020-03-12 NOTE — Telephone Encounter (Signed)
Hi Dr. Rush Landmark,  Patient called requesting to transfer care from you to Dr. Bryan Lemma for the patient stated she changed her job location and [employer] is closer for her.   Please advise on scheduling pending approval.   Thank you

## 2020-03-12 NOTE — Telephone Encounter (Signed)
I have no issue should patient want to try and have a GI MD who is closer to home and her workplace. Will defer final decision however to Dr. Bryan Lemma. I appreciate the time that I have had taking care of her and am happy to continue should he not accept.  GM

## 2020-03-13 NOTE — Telephone Encounter (Signed)
No problem, we can certainly see her in the [facility]. Thanks.

## 2020-03-20 ENCOUNTER — Other Ambulatory Visit: Payer: Self-pay

## 2020-03-20 ENCOUNTER — Ambulatory Visit
Admission: RE | Admit: 2020-03-20 | Discharge: 2020-03-20 | Disposition: A | Discharge status: Home / Self Care | Payer: PRIVATE HEALTH INSURANCE | Source: Ambulatory Visit | Attending: Family Medicine | Admitting: Family Medicine

## 2020-03-20 ENCOUNTER — Ambulatory Visit
Admission: RE | Admit: 2020-03-20 | Discharge: 2020-03-20 | Disposition: A | Discharge status: Home / Self Care | Payer: PRIVATE HEALTH INSURANCE | Source: Ambulatory Visit | Attending: Emergency Medicine | Admitting: Emergency Medicine

## 2020-03-20 ENCOUNTER — Ambulatory Visit
Admission: EM | Admit: 2020-03-20 | Discharge: 2020-03-20 | Disposition: A | Discharge status: Home / Self Care | Payer: PRIVATE HEALTH INSURANCE | Attending: Family Medicine | Admitting: Family Medicine

## 2020-03-20 DIAGNOSIS — M79661 Pain in right lower leg: Secondary | ICD-10-CM | POA: Diagnosis present

## 2020-03-20 NOTE — ED Provider Notes (Addendum)
[facility]    CSN: [id number] Arrival date & time: 03/20/20  1646      History   Chief Complaint Chief Complaint  Patient presents with  . Leg Pain    Right lower leg    HPI Wanda Acosta is a 29 y.o. female.    HPI  29 year old female presents today with right calf pain.  She states it started yesterday and is progressively worsened.  Today she states that she is hardly able to walk on it because of the pain.  The pain is localized to the calf and radiates down to the ankle.  She has no back pain has no thigh pain.  She states that it does 8 hours a day at work.  He does not exercise.  She has not taken any long trips.  O2 sats are 100% on room air, respirations 20, pulse of 116, blood pressure 150/89.  Birth control is with Nexplanon implant.        Past Medical History:  Diagnosis Date  . Anxiety 12/10/2018   on lexapro  . Dysmenorrhea   . Exocrine pancreatic insufficiency 2020  . GERD (gastroesophageal reflux disease)   . Obesity     Patient Active Problem List   Diagnosis Date Noted  . Situational anxiety 04/27/2019  . Abdominal cramping 03/12/2019  . Diarrhea 03/12/2019  . Other fatigue 03/12/2019  . Chronic diarrhea 02/03/2019  . Generalized abdominal pain 02/03/2019  . Rectal bleeding 02/03/2019  . Hemorrhoids 02/03/2019  . Exocrine pancreatic insufficiency 01/27/2019  . Change in bowel habits 12/03/2018  . Nausea without vomiting 12/03/2018  . Periumbilical abdominal pain 11/10/2018  . Non-intractable vomiting 11/10/2018  . Gastroesophageal reflux disease 05/17/2018  . Environmental allergies 05/17/2018  . Nexplanon insertion 04/01/2017    Past Surgical History:  Procedure Laterality Date  . OTHER SURGICAL HISTORY     Finger reattachment    OB History    Gravida  1   Para  1   Term      Preterm      AB      Living  1     SAB      TAB      Ectopic      Multiple      Live Births  1            Home  Medications    Prior to Admission medications   Medication Sig Start Date End Date Taking? Authorizing Provider  clonazePAM (KLONOPIN) 0.5 MG tablet Take 1 tablet (0.5 mg total) by mouth 2 (two) times daily as needed for anxiety. 03/09/20  Yes Wendling, Crosby Oyster, DO  etonogestrel (NEXPLANON) 68 MG IMPL implant 1 each by Subdermal route once.   Yes [provider]  lipase/protease/amylase (CREON) 36000 UNITS CPEP capsule Take 4 capsules with each meal. Take 1 to 2 capsules with each snack. 05/17/19  Yes Mansouraty, Telford Nab., MD  mirtazapine (REMERON) 30 MG tablet TAKE 1 TABLET BY MOUTH AT BEDTIME. 02/27/20  Yes Wendling, Crosby Oyster, DO  ondansetron (ZOFRAN ODT) 8 MG disintegrating tablet Take 1 tablet (8 mg total) by mouth every 8 (eight) hours as needed for nausea, vomiting or refractory nausea / vomiting. 05/31/19  Yes Shelda Pal, DO  pantoprazole (PROTONIX) 40 MG tablet TAKE 1 TABLET BY MOUTH EVERY DAY 03/09/20  Yes Mansouraty, Telford Nab., MD  venlafaxine XR (EFFEXOR XR) 150 MG 24 hr capsule Take 1 capsule (150 mg total) by mouth daily with  breakfast. 11/30/19  Yes Wendling, Crosby Oyster, DO    Family History Family History  Problem Relation Age of Onset  . Hypertension Father   . Diabetes Maternal Grandfather   . Colon cancer Paternal Grandfather 52  . Esophageal cancer Neg Hx   . Inflammatory bowel disease Neg Hx   . Liver disease Neg Hx   . Pancreatic cancer Neg Hx   . Stomach cancer Neg Hx     Social History Social History   Tobacco Use  . Smoking status: Never Smoker  . Smokeless tobacco: Never Used  Vaping Use  . Vaping Use: Every day  Substance Use Topics  . Alcohol use: Never  . Drug use: Never     Allergies   Patient has no known allergies.   Review of Systems Review of Systems  Constitutional: Positive for activity change. Negative for appetite change, chills, diaphoresis, fatigue and fever.  Musculoskeletal: Positive for  myalgias.  All other systems reviewed and are negative.    Physical Exam Triage Vital Signs ED Triage Vitals  Enc Vitals Group     BP 03/20/20 1731 (!) 150/89     Pulse Rate 03/20/20 1731 (!) 116     Resp 03/20/20 1731 20     Temp 03/20/20 1731 98.6 F (37 C)     Temp Source 03/20/20 1731 Oral     SpO2 03/20/20 1731 100 %     Weight 03/20/20 1734 220 lb (99.8 kg)     Height 03/20/20 1734 5\' 8"  (1.727 m)     Head Circumference --      Peak Flow --      Pain Score 03/20/20 1733 9     Pain Loc --      Pain Edu? --      Excl. in [place]? --    No data found.  Updated Vital Signs BP (!) 150/89 (BP Location: Right Arm)   Pulse (!) 116   Temp 98.6 F (37 C) (Oral)   Resp 20   Ht 5\' 8"  (1.727 m)   Wt 220 lb (99.8 kg)   SpO2 100%   BMI 33.45 kg/m   Visual Acuity Right Eye Distance:   Left Eye Distance:   Bilateral Distance:    Right Eye Near:   Left Eye Near:    Bilateral Near:     Physical Exam Vitals and nursing note reviewed.  Constitutional:      General: She is not in acute distress.    Appearance: Normal appearance. She is not ill-appearing or toxic-appearing.  HENT:     Head: Normocephalic and atraumatic.  Eyes:     Conjunctiva/sclera: Conjunctivae normal.  Pulmonary:     Effort: Pulmonary effort is normal.     Breath sounds: Normal breath sounds.  Musculoskeletal:        General: Tenderness present. No signs of injury.     Cervical back: Normal range of motion and neck supple.  Skin:    General: Skin is warm and dry.  Neurological:     General: No focal deficit present.     Mental Status: She is alert and oriented to person, place, and time.  Psychiatric:        Mood and Affect: Mood normal.        Behavior: Behavior normal.        Thought Content: Thought content normal.        Judgment: Judgment normal.      UC Treatments / Results  Labs (all labs ordered are listed, but only abnormal results are displayed) Labs Reviewed - No data to  display  EKG   Radiology US Venous Img Lower Unilateral Right (DVT)  Result Date: 03/20/2020 CLINICAL DATA:  Right calf pain EXAM: RIGHT LOWER EXTREMITY VENOUS DOPPLER ULTRASOUND TECHNIQUE: Gray-scale sonography with compression, as well as color and duplex ultrasound, were performed to evaluate the deep venous system(s) from the level of the common femoral vein through the popliteal and proximal calf veins. COMPARISON:  None. FINDINGS: VENOUS Normal compressibility of the common femoral, superficial femoral, and popliteal veins, as well as the visualized calf veins. Visualized portions of profunda femoral vein and great saphenous vein unremarkable. No filling defects to suggest DVT on grayscale or color Doppler imaging. Doppler waveforms show normal direction of venous flow, normal respiratory plasticity and response to augmentation. Limited views of the contralateral common femoral vein are unremarkable. OTHER None. Limitations: none IMPRESSION: Negative. Electronically Signed   By: Fidela Salisbury MD   On: 03/20/2020 19:38    Procedures Procedures (including critical care time)  Medications Ordered in UC Medications - No data to display  Initial Impression / Assessment and Plan / UC Course  I have reviewed the triage vital signs and the nursing notes.  Pertinent labs & imaging results that were available during my care of the patient were reviewed by me and considered in my medical decision making (see chart for details).   A 29 year old female presented with right calf pain that was of sudden onset and no history of injury.  This that it was very uncomfortable to walk.  There is no history of back pain or thigh pain she is to strictly concentrated over the proximal calf just distal to the popliteal fossa.  There is no redness or warmth.  Modified Wells criteria of 1 but because of the pain with no history of injury it was felt that she should undergo a venous Doppler ultrasound to rule out  DVT.  This was arranged through [place] [facility].  Patient will go there tonight for the ultrasound.  If she does have a DVT she will see the ER physicians for initiation of anticoagulation.  If the results are negative she will follow-up with her primary care physician for further evaluation.  I told her she may take ibuprofen for discomfort and should avoid symptoms as much as possible.  She left our facility to drive in a privately owned vehicle and she was stable at the time of discharge.   Final Clinical Impressions(s) / UC Diagnoses   Final diagnoses:  Right calf pain     Discharge Instructions     Go directly to [facility] emergency  department.  You do not need to be seen in the  emergency department and will go to imaging for the venous Doppler ultrasound.  If you do have a positive venous Doppler ultrasound for DVT you should go to the emergency room for further care.  If it is negative for DVT then you need to follow-up with your primary care physician.  In the meantime you may take ibuprofen for the pain.    ED Prescriptions    None     PDMP not reviewed this encounter.   Lorin Picket, PA-C 03/20/20 1833   Addendum: Notified by phone from our imaging that her venous Doppler ultrasound was negative.  Therefore the patient was advised to avoid symptoms much as possible take ibuprofen for the discomfort and follow-up this week with  her primary care physician.   Lorin Picket, PA-C 03/20/20 2003

## 2020-03-20 NOTE — Discharge Instructions (Addendum)
Go directly to [facility] emergency  department.  You do not need to be seen in the  emergency department and will go to imaging for the venous Doppler ultrasound.  If you do have a positive venous Doppler ultrasound for DVT you should go to the emergency room for further care.  If it is negative for DVT then you need to follow-up with your primary care physician.  In the meantime you may take ibuprofen for the pain.

## 2020-03-20 NOTE — ED Triage Notes (Signed)
Patient in today w/ c/o right lower leg pain. Patient states it started yesterday and has progressively gotten worse. Today, she states she could hardly walk on it.

## 2020-03-22 ENCOUNTER — Other Ambulatory Visit: Payer: Self-pay | Admitting: Family Medicine

## 2020-03-22 DIAGNOSIS — F418 Other specified anxiety disorders: Secondary | ICD-10-CM

## 2020-03-27 NOTE — Telephone Encounter (Signed)
Called patient to advise left voicemail. °

## 2020-03-29 ENCOUNTER — Other Ambulatory Visit: Payer: Self-pay | Admitting: Family Medicine

## 2020-03-29 DIAGNOSIS — F411 Generalized anxiety disorder: Secondary | ICD-10-CM

## 2020-04-16 ENCOUNTER — Ambulatory Visit
Admission: EM | Admit: 2020-04-16 | Discharge: 2020-04-16 | Disposition: A | Discharge status: Home / Self Care | Payer: 59 | Attending: Physician Assistant | Admitting: Physician Assistant

## 2020-04-16 ENCOUNTER — Other Ambulatory Visit: Payer: Self-pay

## 2020-04-16 DIAGNOSIS — M26623 Arthralgia of bilateral temporomandibular joint: Secondary | ICD-10-CM | POA: Diagnosis not present

## 2020-04-16 DIAGNOSIS — H9202 Otalgia, left ear: Secondary | ICD-10-CM

## 2020-04-16 DIAGNOSIS — H6504 Acute serous otitis media, recurrent, right ear: Secondary | ICD-10-CM

## 2020-04-16 DIAGNOSIS — H9201 Otalgia, right ear: Secondary | ICD-10-CM | POA: Diagnosis not present

## 2020-04-16 MED ORDER — CEFDINIR 300 MG PO CAPS: 300.0000 mg | ORAL_CAPSULE | Freq: Two times a day (BID) | ORAL | 0 refills | Status: AC

## 2020-04-16 MED ORDER — CYCLOBENZAPRINE HCL 10 MG PO TABS: 10.0000 mg | ORAL_TABLET | Freq: Three times a day (TID) | ORAL | 0 refills | Status: AC | PRN

## 2020-04-16 MED ORDER — IPRATROPIUM BROMIDE 0.06 % NA SOLN: 2.0000 | Freq: Four times a day (QID) | NASAL | 12 refills | Status: DC

## 2020-04-16 MED ORDER — DICLOFENAC SODIUM 75 MG PO TBEC: 75.0000 mg | DELAYED_RELEASE_TABLET | Freq: Two times a day (BID) | ORAL | 0 refills | Status: AC

## 2020-04-16 NOTE — ED Provider Notes (Signed)
[facility]    CSN: [id number] Arrival date & time: 04/16/20  1741      History   Chief Complaint Chief Complaint  Patient presents with  . Otalgia    Bilateral ear pain    HPI Wanda Acosta is a 29 y.o. female.   Patient presents for bilateral ear pain x4 days.  She says that she went to a different urgent care 3 days ago and was told she had an otitis externa and given an antibiotic/corticosteroid drop for the right ear.  She says over the past 3 days her condition has greatly worsened.  She says that she has pain in both ears still with worse pain in the right ear.  She says the ear does hurt to touch.  She says she has radiating pain all the way down her neck.  She has pain in front of the ears as well.  Patient denies any fever, headaches, congestion, sore throat, cough.  She states that she had a similar problem at the end of July and was treated with Augmentin and nasal spray and got better.  She says her symptoms feel the same except a little worse now.  She has had to take 800 mg ibuprofen for the pain and says that does not usually help.  She says she cannot sleep at night because the pain is so bad.  She denies any issues with her ears before July 2021.  She has no known Covid exposure has been fully vaccinated.  She has no other concerns today.  HPI  Past Medical History:  Diagnosis Date  . Anxiety 12/10/2018   on lexapro  . Dysmenorrhea   . Exocrine pancreatic insufficiency 2020  . GERD (gastroesophageal reflux disease)   . Obesity     Patient Active Problem List   Diagnosis Date Noted  . Situational anxiety 04/27/2019  . Abdominal cramping 03/12/2019  . Diarrhea 03/12/2019  . Other fatigue 03/12/2019  . Chronic diarrhea 02/03/2019  . Generalized abdominal pain 02/03/2019  . Rectal bleeding 02/03/2019  . Hemorrhoids 02/03/2019  . Exocrine pancreatic insufficiency 01/27/2019  . Change in bowel habits 12/03/2018  . Nausea without vomiting 12/03/2018  .  Periumbilical abdominal pain 11/10/2018  . Non-intractable vomiting 11/10/2018  . Gastroesophageal reflux disease 05/17/2018  . Environmental allergies 05/17/2018  . Nexplanon insertion 04/01/2017    Past Surgical History:  Procedure Laterality Date  . OTHER SURGICAL HISTORY     Finger reattachment    OB History    Gravida  1   Para  1   Term      Preterm      AB      Living  1     SAB      TAB      Ectopic      Multiple      Live Births  1            Home Medications    Prior to Admission medications   Medication Sig Start Date End Date Taking? Authorizing Provider  clonazePAM (KLONOPIN) 0.5 MG tablet Take 1 tablet (0.5 mg total) by mouth 2 (two) times daily as needed for anxiety. 03/09/20  Yes Wendling, Crosby Oyster, DO  etonogestrel (NEXPLANON) 68 MG IMPL implant 1 each by Subdermal route once.   Yes [provider]  lipase/protease/amylase (CREON) 36000 UNITS CPEP capsule Take 4 capsules with each meal. Take 1 to 2 capsules with each snack. 05/17/19  Yes Mansouraty, Telford Nab.,  MD  mirtazapine (REMERON) 30 MG tablet TAKE 1 TABLET BY MOUTH AT BEDTIME. 03/22/20  Yes Wendling, Crosby Oyster, DO  ondansetron (ZOFRAN ODT) 8 MG disintegrating tablet Take 1 tablet (8 mg total) by mouth every 8 (eight) hours as needed for nausea, vomiting or refractory nausea / vomiting. 05/31/19  Yes Shelda Pal, DO  pantoprazole (PROTONIX) 40 MG tablet TAKE 1 TABLET BY MOUTH EVERY DAY 03/09/20  Yes Mansouraty, Telford Nab., MD  venlafaxine XR (EFFEXOR-XR) 150 MG 24 hr capsule TAKE 1 CAPSULE BY MOUTH EVERY DAY WITH BREAKFAST 03/29/20  Yes Shelda Pal, DO  cefdinir (OMNICEF) 300 MG capsule Take 1 capsule (300 mg total) by mouth 2 (two) times daily for 10 days. 04/16/20 04/26/20  Laurene Footman B, PA-C  cyclobenzaprine (FLEXERIL) 10 MG tablet Take 1 tablet (10 mg total) by mouth 3 (three) times daily as needed for up to 10 days for muscle spasms. 04/16/20  04/26/20  Danton Clap, PA-C  diclofenac (VOLTAREN) 75 MG EC tablet Take 1 tablet (75 mg total) by mouth 2 (two) times daily for 10 days. 04/16/20 04/26/20  Laurene Footman B, PA-C  ipratropium (ATROVENT) 0.06 % nasal spray Place 2 sprays into both nostrils 4 (four) times daily. 04/16/20   Danton Clap, PA-C    Family History Family History  Problem Relation Age of Onset  . Hypertension Father   . Diabetes Maternal Grandfather   . Colon cancer Paternal Grandfather 86  . Esophageal cancer Neg Hx   . Inflammatory bowel disease Neg Hx   . Liver disease Neg Hx   . Pancreatic cancer Neg Hx   . Stomach cancer Neg Hx     Social History Social History   Tobacco Use  . Smoking status: Never Smoker  . Smokeless tobacco: Never Used  Vaping Use  . Vaping Use: Every day  Substance Use Topics  . Alcohol use: Never  . Drug use: Never     Allergies   Patient has no known allergies.   Review of Systems Review of Systems  Constitutional: Negative for chills, diaphoresis, fatigue and fever.  HENT: Positive for ear pain. Negative for congestion, ear discharge, hearing loss, rhinorrhea, sinus pressure, sinus pain and sore throat.   Respiratory: Negative for cough and shortness of breath.   Gastrointestinal: Negative for abdominal pain, nausea and vomiting.  Musculoskeletal: Positive for neck pain. Negative for arthralgias, myalgias and neck stiffness.  Skin: Negative for rash.  Neurological: Positive for headaches. Negative for weakness.  Hematological: Negative for adenopathy.     Physical Exam Triage Vital Signs ED Triage Vitals  Enc Vitals Group     BP 04/16/20 1819 (!) 133/101     Pulse Rate 04/16/20 1819 (!) 108     Resp 04/16/20 1819 18     Temp 04/16/20 1819 99.1 F (37.3 C)     Temp Source 04/16/20 1819 Oral     SpO2 04/16/20 1819 100 %     Weight 04/16/20 1822 225 lb (102.1 kg)     Height 04/16/20 1822 5\' 8"  (1.727 m)     Head Circumference --      Peak Flow --       Pain Score 04/16/20 1822 7     Pain Loc --      Pain Edu? --      Excl. in [place]? --    No data found.  Updated Vital Signs BP (!) 133/101 (BP Location: Right Arm)   Pulse (!) 108  Temp 99.1 F (37.3 C) (Oral)   Resp 18   Ht 5\' 8"  (1.727 m)   Wt 225 lb (102.1 kg)   SpO2 100%   BMI 34.21 kg/m       Physical Exam Vitals and nursing note reviewed.  Constitutional:      General: She is not in acute distress.    Appearance: Normal appearance. She is not ill-appearing or toxic-appearing.  HENT:     Head: Normocephalic and atraumatic.     Right Ear: Hearing, ear canal and external ear normal. Tenderness present. No drainage or swelling. A middle ear effusion is present. Tympanic membrane is injected.     Left Ear: Hearing, ear canal and external ear normal. Tenderness present. No drainage or swelling. A middle ear effusion is present.     Ears:     Comments: TTP bilateral TMJ--pain at this site when opening mouth. No clicking or locking    Nose: Nose normal.     Mouth/Throat:     Mouth: Mucous membranes are moist.     Pharynx: Oropharynx is clear.  Eyes:     General: No scleral icterus.       Right eye: No discharge.        Left eye: No discharge.     Conjunctiva/sclera: Conjunctivae normal.  Cardiovascular:     Rate and Rhythm: Normal rate and regular rhythm.     Heart sounds: Normal heart sounds.  Pulmonary:     Effort: Pulmonary effort is normal. No respiratory distress.     Breath sounds: Normal breath sounds.  Musculoskeletal:     Cervical back: Normal range of motion and neck supple. Tenderness (diffuse TTP anterior and lateral neck) present.  Lymphadenopathy:     Cervical: No cervical adenopathy.  Skin:    General: Skin is dry.  Neurological:     General: No focal deficit present.     Mental Status: She is alert. Mental status is at baseline.     Motor: No weakness.     Gait: Gait normal.  Psychiatric:        Mood and Affect: Mood normal.        Behavior:  Behavior normal.        Thought Content: Thought content normal.      UC Treatments / Results  Labs (all labs ordered are listed, but only abnormal results are displayed) Labs Reviewed - No data to display  EKG   Radiology No results found.  Procedures Procedures (including critical care time)  Medications Ordered in UC Medications - No data to display  Initial Impression / Assessment and Plan / UC Course  I have reviewed the triage vital signs and the nursing notes.  Pertinent labs & imaging results that were available during my care of the patient were reviewed by me and considered in my medical decision making (see chart for details).   On exam patient has mild right and left middle ear effusion with slight injection of the right TM.  Ear canals are both clear.  Suspect otitis media with effusion of the right ear.  Patient does have tenderness of the TMJ joint bilaterally and tenderness of the neck as well.  Advised patient that most of her ear pain may actually be due to TMJ pain. She states that her symptoms were similar a couple months ago, but not as bad as they are now and did not involve the neck pain.  She said that she did get better that  time with an antibiotic and nasal spray.  Advised her that otitis media effusion is not generally treated with antibiotics, but since she appears to be in so much pain I did agree to give her cefdinir and Atrovent nasal spray.  I also advised her that I would like to treat her for TMJ pain with an anti-inflammatory and muscle relaxer at night.  Advised her that if she is not getting better with these treatments she needs to follow-up with ENT specialist.  Final Clinical Impressions(s) / UC Diagnoses   Final diagnoses:  Otalgia of right ear  Otalgia of left ear  Bilateral temporomandibular joint pain     Discharge Instructions     Please follow-up with ENT specialist if you do not get better with this medication regimen.    ED  Prescriptions    Medication Sig Dispense Auth. Provider   cefdinir (OMNICEF) 300 MG capsule Take 1 capsule (300 mg total) by mouth 2 (two) times daily for 10 days. 20 capsule Laurene Footman B, PA-C   diclofenac (VOLTAREN) 75 MG EC tablet Take 1 tablet (75 mg total) by mouth 2 (two) times daily for 10 days. 20 tablet Laurene Footman B, PA-C   cyclobenzaprine (FLEXERIL) 10 MG tablet Take 1 tablet (10 mg total) by mouth 3 (three) times daily as needed for up to 10 days for muscle spasms. 20 tablet Laurene Footman B, PA-C   ipratropium (ATROVENT) 0.06 % nasal spray Place 2 sprays into both nostrils 4 (four) times daily. 15 mL Danton Clap, PA-C     PDMP not reviewed this encounter.   Danton Clap, PA-C 04/17/20 [phone]

## 2020-04-16 NOTE — Discharge Instructions (Addendum)
Please follow-up with ENT specialist if you do not get better with this medication regimen.

## 2020-04-16 NOTE — ED Triage Notes (Signed)
Patient in today w/ c/o bilateral ear pain since last Thursday. Patient states the pain is now keeping her awake at night.

## 2020-04-19 ENCOUNTER — Other Ambulatory Visit: Payer: Self-pay | Admitting: Family Medicine

## 2020-04-19 DIAGNOSIS — F418 Other specified anxiety disorders: Secondary | ICD-10-CM

## 2020-04-30 ENCOUNTER — Other Ambulatory Visit: Payer: Self-pay

## 2020-04-30 ENCOUNTER — Encounter: Payer: Self-pay | Admitting: Obstetrics and Gynecology

## 2020-04-30 ENCOUNTER — Ambulatory Visit (INDEPENDENT_AMBULATORY_CARE_PROVIDER_SITE_OTHER): Payer: PRIVATE HEALTH INSURANCE | Admitting: Obstetrics and Gynecology

## 2020-04-30 VITALS — BP 130/84 | HR 100 | Resp 16 | Ht 68.0 in | Wt 230.8 lb

## 2020-04-30 DIAGNOSIS — Z01419 Encounter for gynecological examination (general) (routine) without abnormal findings: Secondary | ICD-10-CM | POA: Diagnosis not present

## 2020-04-30 NOTE — Patient Instructions (Signed)

## 2020-04-30 NOTE — Progress Notes (Signed)
29 y.o. G1P1 Married Caucasian female here for annual exam.    She is using her third Nexplanon.  She does have a menstruation off an on, and it is heavy and long.  Lasts 10 days.  Using a disc and changes every 12 hours.   Doing therapy and taking medication for anxiety.   She is waiting to see rheumatologist.   Taking vit D.   Vaccinated against Covid.  Flu vaccine completed.  PCP:  Riki Sheer, DO  Patient's last menstrual period was 02/26/2020 (approximate).     Period Cycle (Days):  (Irregular with Nexplanon) Period Pattern: (!) Irregular     Sexually active: Yes.    The current method of family planning is Nexplanon 05-25-19.    Exercising: No.  The patient does not participate in regular exercise at present. Smoker:  no  Health Maintenance: Pap: 04-26-19 Neg:Neg HR HPV, 2019 normal per patient History of abnormal Pap:  no MMG:  n/a Colonoscopy: 11/2018 benign polyp;next 10 years BMD:   n/a  Result  n/a TDaP:  Up to date per patient w/i 10 years Gardasil:   Patient had 1 injection--HAD REACTION HIV: Neg in pregnancy Hep C:Neg during surrogacy Screening Labs:  Today.    reports that she has never smoked. She has never used smokeless tobacco. She reports current drug use. Frequency: 1.00 time per week. Drug: Marijuana. She reports that she does not drink alcohol.  Past Medical History:  Diagnosis Date  . Anxiety 12/10/2018   on lexapro  . Depression   . Dysmenorrhea   . Exocrine pancreatic insufficiency 2020  . GERD (gastroesophageal reflux disease)   . Obesity     Past Surgical History:  Procedure Laterality Date  . OTHER SURGICAL HISTORY     Finger reattachment    Current Outpatient Medications  Medication Sig Dispense Refill  . clonazePAM (KLONOPIN) 0.5 MG tablet Take 1 tablet (0.5 mg total) by mouth 2 (two) times daily as needed for anxiety. 30 tablet 0  . etonogestrel (NEXPLANON) 68 MG IMPL implant 1 each by Subdermal route once.    Marland Kitchen  ibuprofen (ADVIL) 800 MG tablet ibuprofen 800 mg tablet  TAKE 1 TABLET BY MOUTH EVERY 8 HOURS AS NEEDED    . lipase/protease/amylase (CREON) 36000 UNITS CPEP capsule Take 4 capsules with each meal. Take 1 to 2 capsules with each snack. 340 capsule 4  . mirtazapine (REMERON) 30 MG tablet TAKE 1 TABLET BY MOUTH AT BEDTIME. 30 tablet 0  . ondansetron (ZOFRAN ODT) 8 MG disintegrating tablet Take 1 tablet (8 mg total) by mouth every 8 (eight) hours as needed for nausea, vomiting or refractory nausea / vomiting. 30 tablet 3  . pantoprazole (PROTONIX) 40 MG tablet TAKE 1 TABLET BY MOUTH EVERY DAY 30 tablet 1  . venlafaxine XR (EFFEXOR-XR) 150 MG 24 hr capsule TAKE 1 CAPSULE BY MOUTH EVERY DAY WITH BREAKFAST 30 capsule 3   No current facility-administered medications for this visit.    Family History  Problem Relation Age of Onset  . Hypertension Father   . Diabetes Maternal Grandfather   . Colon cancer Paternal Grandfather 22  . Esophageal cancer Neg Hx   . Inflammatory bowel disease Neg Hx   . Liver disease Neg Hx   . Pancreatic cancer Neg Hx   . Stomach cancer Neg Hx     Review of Systems  All other systems reviewed and are negative.   Exam:   BP 130/84 (Cuff Size: Large)   Pulse  100   Resp 16   Ht 5\' 8"  (1.727 m)   Wt 230 lb 12.8 oz (104.7 kg)   LMP 02/26/2020 (Approximate)   BMI 35.09 kg/m     General appearance: alert, cooperative and appears stated age Head: normocephalic, without obvious abnormality, atraumatic Neck: no adenopathy, supple, symmetrical, trachea midline and thyroid normal to inspection and palpation Lungs: clear to auscultation bilaterally Breasts: normal appearance, no masses or tenderness, No nipple retraction or dimpling, No nipple discharge or bleeding, No axillary adenopathy Heart: regular rate and rhythm Abdomen: soft, non-tender; no masses, no organomegaly Extremities: extremities normal, atraumatic, no cyanosis or edema.  Nexplanon present left arm  and feels intact.  Skin: skin color, texture, turgor normal. No rashes or lesions Lymph nodes: cervical, supraclavicular, and axillary nodes normal. Neurologic: grossly normal  Pelvic: External genitalia:  no lesions              No abnormal inguinal nodes palpated.              Urethra:  normal appearing urethra with no masses, tenderness or lesions              Bartholins and Skenes: normal                 Vagina: normal appearing vagina with normal color and discharge, no lesions              Cervix: no lesions              Pap taken: No. Bimanual Exam:  Uterus:  normal size, contour, position, consistency, mobility, non-tender              Adnexa: no mass, fullness, tenderness      Chaperone was present for exam.  Assessment:   Well woman visit with normal exam. Migraine with aura.  Nexplanon patient.  Hx IVF for surrogacy for another family.  Plan: Mammogram screening discussed. Self breast awareness reviewed. Pap and HR HPV 2025.  New guidelines discussed.  Guidelines for Calcium, Vitamin D, regular exercise program including cardiovascular and weight bearing exercise. CBC and cholesterol panel.  Follow up annually and prn.   After visit summary provided.

## 2020-05-01 ENCOUNTER — Ambulatory Visit (INDEPENDENT_AMBULATORY_CARE_PROVIDER_SITE_OTHER): Payer: PRIVATE HEALTH INSURANCE | Admitting: Family Medicine

## 2020-05-01 ENCOUNTER — Encounter: Payer: Self-pay | Admitting: Family Medicine

## 2020-05-01 ENCOUNTER — Other Ambulatory Visit: Payer: Self-pay

## 2020-05-01 VITALS — BP 120/90 | HR 106 | Temp 98.6°F | Ht 68.0 in | Wt 230.0 lb

## 2020-05-01 DIAGNOSIS — H9203 Otalgia, bilateral: Secondary | ICD-10-CM

## 2020-05-01 DIAGNOSIS — M7661 Achilles tendinitis, right leg: Secondary | ICD-10-CM

## 2020-05-01 DIAGNOSIS — F411 Generalized anxiety disorder: Secondary | ICD-10-CM

## 2020-05-01 DIAGNOSIS — F321 Major depressive disorder, single episode, moderate: Secondary | ICD-10-CM | POA: Diagnosis not present

## 2020-05-01 DIAGNOSIS — M722 Plantar fascial fibromatosis: Secondary | ICD-10-CM | POA: Diagnosis not present

## 2020-05-01 LAB — CBC
Hematocrit: 42.3 % (ref 34.0–46.6)
Hemoglobin: 13.5 g/dL (ref 11.1–15.9)
MCH: 26.3 pg — ABNORMAL LOW (ref 26.6–33.0)
MCHC: 31.9 g/dL (ref 31.5–35.7)
MCV: 82 fL (ref 79–97)
Platelets: 376 10*3/uL (ref 150–450)
RBC: 5.14 x10E6/uL (ref 3.77–5.28)
RDW: 12.3 % (ref 11.7–15.4)
WBC: 9.8 10*3/uL (ref 3.4–10.8)

## 2020-05-01 LAB — LIPID PANEL
Chol/HDL Ratio: 3.8 ratio (ref 0.0–4.4)
Cholesterol, Total: 209 mg/dL — ABNORMAL HIGH (ref 100–199)
HDL: 55 mg/dL (ref >39)
LDL Chol Calc (NIH): 125 mg/dL — ABNORMAL HIGH (ref 0–99)
Triglycerides: 163 mg/dL — ABNORMAL HIGH (ref 0–149)
VLDL Cholesterol Cal: 29 mg/dL (ref 5–40)

## 2020-05-01 MED ORDER — BUPROPION HCL ER (XL) 150 MG PO TB24: 150.0000 mg | ORAL_TABLET | Freq: Every day | ORAL | 3 refills | Status: DC

## 2020-05-01 MED ORDER — CHOLESTYRAMINE 4 G PO PACK: 4.0000 g | PACK | Freq: Three times a day (TID) | ORAL | 12 refills | Status: DC

## 2020-05-01 NOTE — Progress Notes (Signed)
Musculoskeletal Exam  Patient: Wanda Acosta DOB: 09-16-1990  DOS: 05/01/2020  SUBJECTIVE:  Chief Complaint:   Chief Complaint  Patient presents with  . Foot Pain    right    Shanessa Hodak is a 29 y.o.  female for evaluation and treatment of R heel/ankle pain.   Onset:  6 weeks ago. No inj or change in activity.  Location: Heel/foot Character:  aching  Progression of issue:  is unchanged Associated symptoms: some swelling, pain w ambulation; no bruising, erythema, fevers [place] for DVT by UC in late Aug.  Treatment: to date has been rest, ice, OTC NSAIDS, PT, home exercises and CAM boot.   Neurovascular symptoms: no  Has issues w anxiety/depression. Taking Effexor XR 150 mg/d, tolerating well, no AE's. Compliant. Seeing counseling. Therapist thinks she needs to increase medication. Has been on Wellbutrin before, did not do well coming off of it. Has GI s/s's that could be attributed to this.   B/l ear pain for 2 mo off and on. Drops and PO abx help for a bit and then it returns. She has hx of allergies, taking PO antihistamines. Not currently on INCS.   Past Medical History:  Diagnosis Date  . Anxiety 12/10/2018   on lexapro  . Depression   . Dysmenorrhea   . Exocrine pancreatic insufficiency 2020  . GERD (gastroesophageal reflux disease)   . Obesity     Objective: VITAL SIGNS: BP 120/90 (BP Location: Left Arm, Patient Position: Sitting, Cuff Size: Normal)   Pulse (!) 106   Temp 98.6 F (37 C) (Oral)   Ht 5\' 8"  (1.727 m)   Wt 230 lb (104.3 kg)   SpO2 97%   BMI 34.97 kg/m  Constitutional: Well formed, well developed. No acute distress. HEENT: mild retraction of B/l TM's. Canals patent w/o dc or drainage.  Musculoskeletal: R ankle.   Normal active range of motion: yes.   Normal passive range of motion: yes Tenderness to palpation: yes over prox insertion of plantar fascia and calcaneal tendon Deformity: no Ecchymosis: no Tests positive: none Tests negative: anterior  drawer Neurologic: Normal sensory function. No focal deficits noted. Antalgic gait.  Psychiatric: Normal mood. Age appropriate judgment and insight. Alert & oriented x 3.    Assessment:  Achilles tendinitis of right lower extremity - Plan: MR ANKLE RIGHT WO CONTRAST  Ear pain, bilateral  Plantar fasciitis, right  Depression, major, single episode, moderate (HCC) - Plan: buPROPion (WELLBUTRIN XL) 150 MG 24 hr tablet  GAD (generalized anxiety disorder)  Plan: 1. Cont ice, Tylenol, NSAIDs, boot wearing. Ck MRI.  2. INCS. OK to cont PO antihistamines. 3. [place] sock. Cont w PT. 4. Add wellbutrin XL 150 mg/d. Cont w counseling. 5. Cont w Effexor XR 150 mg/d F/u 1 mo. The patient voiced understanding and agreement to the plan.   [place], DO 05/01/20  8:01 AM

## 2020-05-01 NOTE — Patient Instructions (Addendum)
Consider a [place] sock instead of a night splint.  Ice/cold pack over area for 10-15 min twice daily.  Someone will reach out regarding your MRI. Let me know if there are issues.   Go back on the Flonase.   Let us know if you need anything.

## 2020-05-02 ENCOUNTER — Other Ambulatory Visit: Payer: Self-pay | Admitting: Gastroenterology

## 2020-05-02 DIAGNOSIS — R1033 Periumbilical pain: Secondary | ICD-10-CM

## 2020-05-15 ENCOUNTER — Encounter: Payer: Self-pay | Admitting: Gastroenterology

## 2020-05-15 ENCOUNTER — Ambulatory Visit (INDEPENDENT_AMBULATORY_CARE_PROVIDER_SITE_OTHER): Payer: PRIVATE HEALTH INSURANCE | Admitting: Gastroenterology

## 2020-05-15 VITALS — BP 118/88 | HR 103 | Ht 68.0 in | Wt 232.0 lb

## 2020-05-15 DIAGNOSIS — R7982 Elevated C-reactive protein (CRP): Secondary | ICD-10-CM

## 2020-05-15 DIAGNOSIS — R197 Diarrhea, unspecified: Secondary | ICD-10-CM | POA: Diagnosis not present

## 2020-05-15 DIAGNOSIS — K8681 Exocrine pancreatic insufficiency: Secondary | ICD-10-CM | POA: Diagnosis not present

## 2020-05-15 DIAGNOSIS — E559 Vitamin D deficiency, unspecified: Secondary | ICD-10-CM

## 2020-05-15 DIAGNOSIS — R7689 Other specified abnormal immunological findings in serum: Secondary | ICD-10-CM

## 2020-05-15 DIAGNOSIS — R768 Other specified abnormal immunological findings in serum: Secondary | ICD-10-CM | POA: Diagnosis not present

## 2020-05-15 DIAGNOSIS — K219 Gastro-esophageal reflux disease without esophagitis: Secondary | ICD-10-CM

## 2020-05-15 MED ORDER — RIFAXIMIN 550 MG PO TABS: 550.0000 mg | ORAL_TABLET | Freq: Two times a day (BID) | ORAL | 0 refills | Status: AC

## 2020-05-15 MED ORDER — DIFENOXIN-ATROPINE 1-0.025 MG PO TABS: ORAL_TABLET | ORAL | 0 refills | Status: DC

## 2020-05-15 NOTE — Patient Instructions (Addendum)
If you are age 31 or older, your body mass index should be between 23-30. Your Body mass index is 35.28 kg/m. If this is out of the aforementioned range listed, please consider follow up with your Primary Care Provider.  If you are age 70 or younger, your body mass index should be between 19-25. Your Body mass index is 35.28 kg/m. If this is out of the aformentioned range listed, please consider follow up with your Primary Care Provider.   .We have given you samples of the following medication to take: Rifaximin 550 mg twice daily for 14 days   Please purchase the following medications over the counter and take as directed: Align   We have sent the following medications to your pharmacy for you to pick up at your convenience: [place]  Please go to the lab at [facility] ([place] You will need to go to level "B", you do not need an appointment for this. Hours available are 7:30 am - 4:30 pm.   It was a pleasure to see you today!  Vito Cirigliano, D.O.

## 2020-05-15 NOTE — Progress Notes (Signed)
P  Chief Complaint:    GERD, chronic diarrhea  GI History: 29 year old female with a history of GERD, obesity (BMI 35), hemorrhoids with a history of chronic diarrhea.  Symptoms started abruptly in February/March 2020.  Extensive evaluation notable for the following: -11/05/2018: ER evaluation: Normal CBC, CMP, lipase, UA -10/2018: CT abdomen/pelvis: Normal -10/2018: RUQ [place]: Normal -11/26/2018: CRP 10.6.  Normal TSH, amylase, lipase -11/2018: EGD/colonoscopy largely unrevealing as below -12/2018: Normal/negative HLA DQ 2/DQ 8.  CRP 9.5, pancreatic elastase 62.  Normal/negative Giardia, O&P -01/2019: CRP 11.2.  Normal B12 -Was last seen by Dr. Rush Landmark on 02/01/2019 and diagnosed with exocrine pancreatic insufficiency.  Started on Creon patient reports minimal improvement. -02/2019: CRP 9.5 -05/2019: Normal CBC, CMP, lactate -01/17/2020:Was seen at [facility] on 01/17/2020.  Had ordered repeat CT pancreas protocol, IgG, ANA, CBC, lipase, liver enzymes, fecal fat, pancreatic elastase, CRP, ESR.  Trialed 2 weeks of increased dose of Creon with 7/meal/snack along with low FODMAP diet. -Fecal elastase improved to 159 consistent with mild exocrine pancreas insufficiency but not thought to be the sole cause of her symptoms. -ANA elevated at 1:640 and placed referral to Rheumatology. -CT Pancreas protocol (01/27/2020): Mild hepatic steatosis, otherwise normal -12/2019: Normal CBC, liver enzymes, lipase   -No improvement with previous trial of loperamide 2 mg on demand -Generalized abdominal discomfort and cramping along with abdominal bloating intermittently -Nausea without emesis.  Trialed course of Zofran -Stopped taking Bentyl due to no perceived benefit.  Consider Levsin and IBgard. -Was on PPI  Endoscopic History: -EGD (11/2018): Normal esophagus, irregular Z-line at 40 cm, fundic gland polyps, normal duodenum (path: Benign duodenal mucosa with focally increased IELs) -Colonoscopy (11/2018):  External/internal hemorrhoids, small hyperplastic polyps, otherwise normal colon with benign biopsies.  Normal ileum with benign biopsies.  HPI:     Patient is a 29 y.o. female presenting to the Gastroenterology Clinic for initial appointment with me.  Has had essentially chronic diarrhea since Feb/march 2020 with acute onset abdominal pain. ER eval as symptom onset was unrevelaing as above. Sxs continued to progress with diarrhea, nausea/vomiting, with further eval only n/f low elastase and elevated CRP, and positive ANA as above. No improvement with trial of Creon, and was seen by [facility] for 2nd opinion. Further eval as above with mild EPI only. She returned to her primary care, with referral to Rheum for elevated ANA.  She presents today is essentially third opinion.  Still with multiple loose, watery, non-bloody stools daily. +urgency. +post prandial urgency. +nocturnal stools and occasional nocturnal emesis.   No change with low FODMAP diet x3 months.  Loperamide does help with symptoms.  Retrialed Questran without any change. Will use Imodium 2-3/day to help mitigate sxs, but still with breakthrough.   Reflux also worse over the last year despite pantoprazole 40 mg qhs.   Was on no medications other than OCP prior to sxs onset. All meds and dx have been since sxs onset.   D insuff at 23.    Review of systems:     No chest pain, no SOB, no fevers, no urinary sx   Past Medical History:  Diagnosis Date  . Anxiety 12/10/2018   on lexapro  . Depression   . Dysmenorrhea   . Exocrine pancreatic insufficiency 2020  . GERD (gastroesophageal reflux disease)   . Obesity     Patient's surgical history, family medical history, social history, medications and allergies were all reviewed in Epic    Current Outpatient Medications  Medication Sig Dispense Refill  . buPROPion (WELLBUTRIN XL) 150 MG 24 hr tablet Take 1 tablet (150 mg total) by mouth daily. 30 tablet 3  . cholestyramine  (QUESTRAN) 4 g packet Take 1 packet (4 g total) by mouth 3 (three) times daily with meals. 60 each 12  . clonazePAM (KLONOPIN) 0.5 MG tablet Take 1 tablet (0.5 mg total) by mouth 2 (two) times daily as needed for anxiety. 30 tablet 0  . etonogestrel (NEXPLANON) 68 MG IMPL implant 1 each by Subdermal route once.    Marland Kitchen ibuprofen (ADVIL) 800 MG tablet ibuprofen 800 mg tablet  TAKE 1 TABLET BY MOUTH EVERY 8 HOURS AS NEEDED    . lipase/protease/amylase (CREON) 36000 UNITS CPEP capsule Take 4 capsules with each meal. Take 1 to 2 capsules with each snack. 340 capsule 4  . mirtazapine (REMERON) 30 MG tablet TAKE 1 TABLET BY MOUTH AT BEDTIME. 30 tablet 0  . ondansetron (ZOFRAN ODT) 8 MG disintegrating tablet Take 1 tablet (8 mg total) by mouth every 8 (eight) hours as needed for nausea, vomiting or refractory nausea / vomiting. 30 tablet 3  . pantoprazole (PROTONIX) 40 MG tablet TAKE 1 TABLET BY MOUTH EVERY DAY 30 tablet 1  . venlafaxine XR (EFFEXOR-XR) 150 MG 24 hr capsule TAKE 1 CAPSULE BY MOUTH EVERY DAY WITH BREAKFAST 30 capsule 3   No current facility-administered medications for this visit.    Physical Exam:     BP 118/88   Pulse (!) 103   Ht 5' 8" (1.727 m)   Wt 232 lb (105.2 kg)   BMI 35.28 kg/m   GENERAL:  Pleasant female in NAD PSYCH: : Cooperative, normal affect ABDOMEN:  Nondistended, soft, nontender. No obvious masses, no hepatomegaly,  normal bowel sounds SKIN:  turgor, no lesions seen Musculoskeletal:  Normal muscle tone, normal strength NEURO: Alert and oriented x 3, no focal neurologic deficits   IMPRESSION and PLAN:    1) Chronic diarrhea 2) Elevated CRP 3) Mildly reduced pancreatic fecal elastase/Mild EPI 4) Positive ANA  Etiology for ongoing chronic diarrhea not exactly clear.  Endoscopic evaluation, imaging, and most labs (aside from fecal elastase, CRP, ANA) have been essentially normal.  The abrupt onset nature is a bit curious to, as that will be inconsistent with  pancreatic insufficiency as well as IBS.  Should also have seen a better response to trial of Creon if this was EPI.  Curious about SIBO.  Discussed breath testing versus empiric trial of rifaximin today, and she prefers the latter.  -Motofen trial -Rifaximin tiral -Start Align or other probiotic and continue at least 3 weeks beyond completion of rifaximin -Has appointment in the Rheumatology clinic in December.  Curious about autoimmune overlap syndrome   5) Vitamin D insufficiency -Check iron, B12, folate for other evidence of malabsorption -Previous celiac testing was negative  6) GERD -Continue PPI -Continue antireflux lifestyle/dietary modifications -Can readdress at follow-up  I spent 45 minutes of time, including in depth chart review, independent review of results as outlined above, communicating results with the patient directly, face-to-face time with the patient, coordinating care, ordering studies and medications as appropriate, and documentation.        Lavena Bullion ,DO, FACG 05/15/2020, 8:56 AM

## 2020-05-17 ENCOUNTER — Telehealth: Payer: Self-pay | Admitting: Gastroenterology

## 2020-05-17 ENCOUNTER — Telehealth: Payer: Self-pay | Admitting: Family Medicine

## 2020-05-17 DIAGNOSIS — K8681 Exocrine pancreatic insufficiency: Secondary | ICD-10-CM

## 2020-05-17 NOTE — Telephone Encounter (Signed)
Pt is requesting a call back from a nurse in regards to the [place] medication, she has some questions, she also states the medication Difenoxin-Atropine is not covered by her insurance so she would like an alternative if possible.

## 2020-05-17 NOTE — Telephone Encounter (Signed)
Medication: ondansetron (ZOFRAN ODT) 8 MG disintegrating tablet   Has the patient contacted their pharmacy? No. (If no, request that the patient contact the pharmacy for the refill.) (If yes, when and what did the pharmacy advise?)  Preferred Pharmacy (with phone number or street name):  [id number] - [place], [place] - [address] [place] [address] [place] [address] Phone:  [phone]  Fax:  [phone]       Agent: Please be advised that RX refills may take up to 3 business days. We ask that you follow-up with your pharmacy.

## 2020-05-18 MED ORDER — ONDANSETRON 8 MG PO TBDP: 8.0000 mg | ORAL_TABLET | Freq: Three times a day (TID) | ORAL | 3 refills | Status: DC | PRN

## 2020-05-18 NOTE — Telephone Encounter (Signed)
Refill done.  

## 2020-05-21 ENCOUNTER — Other Ambulatory Visit: Payer: Self-pay

## 2020-05-21 DIAGNOSIS — F418 Other specified anxiety disorders: Secondary | ICD-10-CM

## 2020-05-21 MED ORDER — MIRTAZAPINE 30 MG PO TABS: 30.0000 mg | ORAL_TABLET | Freq: Every day | ORAL | 0 refills | Status: DC

## 2020-05-21 MED ORDER — ONDANSETRON 8 MG PO TBDP: 8.0000 mg | ORAL_TABLET | Freq: Three times a day (TID) | ORAL | 3 refills | Status: DC | PRN

## 2020-05-21 MED ORDER — CLONAZEPAM 0.5 MG PO TABS: 0.5000 mg | ORAL_TABLET | Freq: Two times a day (BID) | ORAL | 0 refills | Status: DC | PRN

## 2020-05-21 NOTE — Telephone Encounter (Signed)
Refill resent pharmacy did not receive

## 2020-05-21 NOTE — Addendum Note (Signed)
Addended by: Sharon Seller B on: 05/21/2020 01:11 PM   Modules accepted: Orders

## 2020-05-21 NOTE — Telephone Encounter (Signed)
Last OV--05/01/2020 Last RF--#30 on 03/09/2020 No CSC//UDS

## 2020-05-21 NOTE — Telephone Encounter (Signed)
Left a message for the patient to contact the office regarding her medications

## 2020-05-28 ENCOUNTER — Telehealth (INDEPENDENT_AMBULATORY_CARE_PROVIDER_SITE_OTHER): Payer: PRIVATE HEALTH INSURANCE | Admitting: Family Medicine

## 2020-05-28 ENCOUNTER — Other Ambulatory Visit: Payer: Self-pay

## 2020-05-28 ENCOUNTER — Encounter: Payer: Self-pay | Admitting: Family Medicine

## 2020-05-28 DIAGNOSIS — M545 Low back pain, unspecified: Secondary | ICD-10-CM | POA: Diagnosis not present

## 2020-05-28 MED ORDER — CYCLOBENZAPRINE HCL 10 MG PO TABS: 5.0000 mg | ORAL_TABLET | Freq: Three times a day (TID) | ORAL | 0 refills | Status: DC | PRN

## 2020-05-28 MED ORDER — ACETAMINOPHEN-CODEINE #3 300-30 MG PO TABS: 1.0000 | ORAL_TABLET | Freq: Four times a day (QID) | ORAL | 0 refills | Status: DC | PRN

## 2020-05-28 MED ORDER — MELOXICAM 15 MG PO TABS: 15.0000 mg | ORAL_TABLET | Freq: Every day | ORAL | 0 refills | Status: DC

## 2020-05-28 NOTE — Progress Notes (Signed)
Musculoskeletal Exam  Patient: Wanda Acosta DOB: Dec 03, 1990  DOS: 05/28/2020  SUBJECTIVE:  Chief Complaint:   Chief Complaint  Patient presents with  . Back Pain    Wanda Acosta is a 29 y.o.  female for evaluation and treatment of her back pain. Due to COVID-19 pandemic, we are interacting via web portal for an electronic face-to-face visit. I verified patient's ID using 2 identifiers. Patient agreed to proceed with visit via this method. Patient is at home, I am at office. Patient and I are present for visit.   Onset:  2 days ago.  Bent over to pick up 59 mo old niece and felt a pull in low back when she stood up.  Location: lower Character:  aching and sharp  Progression of issue:  is unchanged Associated symptoms: no bruising, redness Denies bowel/bladder incontinence or weakness Treatment: to date has been OTC NSAIDS.   Neurovascular symptoms: no  ROS: Musculoskeletal/Extremities: +back pain Neurologic: no numbness, tingling no weakness   Past Medical History:  Diagnosis Date  . Anxiety 12/10/2018   on lexapro  . Depression   . Dysmenorrhea   . Exocrine pancreatic insufficiency 2020  . GERD (gastroesophageal reflux disease)   . Obesity     Objective: No conversational dyspnea Age appropriate judgment and insight Nml affect and mood  Assessment:  Acute bilateral low back pain without sciatica - Plan: cyclobenzaprine (FLEXERIL) 10 MG tablet, meloxicam (MOBIC) 15 MG tablet, acetaminophen-codeine (TYLENOL #3) 300-30 MG tablet  Plan: Stretches/exercises, heat, ice, Mobic, Flexeril (does not make her sleepy), T3's for breakthrough pain. F/u prn. The patient voiced understanding and agreement to the plan.   [place], DO 05/28/20  2:31 PM

## 2020-05-29 ENCOUNTER — Ambulatory Visit: Payer: PRIVATE HEALTH INSURANCE | Admitting: Family Medicine

## 2020-05-30 ENCOUNTER — Ambulatory Visit: Payer: PRIVATE HEALTH INSURANCE | Admitting: Family Medicine

## 2020-05-30 DIAGNOSIS — Z0289 Encounter for other administrative examinations: Secondary | ICD-10-CM

## 2020-06-06 ENCOUNTER — Other Ambulatory Visit: Payer: Self-pay

## 2020-06-06 DIAGNOSIS — F418 Other specified anxiety disorders: Secondary | ICD-10-CM

## 2020-06-06 MED ORDER — MIRTAZAPINE 30 MG PO TABS: 30.0000 mg | ORAL_TABLET | Freq: Every day | ORAL | 1 refills | Status: DC

## 2020-06-15 NOTE — Progress Notes (Signed)
Office Visit Note  Patient: Wanda Acosta             Date of Birth: April 29, 1991           MRN: [id number]             PCP: Shelda Pal, DO Referring: Shelda Pal* Visit Date: 06/27/2020 Occupation: @GUAROCC @  Subjective:  Chronic diarrhea, joint pain.   History of Present Illness: Wanda Acosta is a 29 y.o. female seen in consultation per request of her PCP.  According to the patient in March 2020 she had severe abdominal pain for which she went to the emergency room.  She states after that she started having nausea vomiting decreased appetite and chronic diarrhea.  She was admitted by a gastroenterologist in [place] who did endoscopy and colonoscopy.  According the patient the test were negative.  She also had some labs which showed elevated lipase and amylase.  She was diagnosed with possible EPI and was placed on Creon.  She did not notice any improvement in her diarrhea on Creon.  She went to [place] for a second opinion where she had extensive labs.  Her lipase and amylase was normal.  Her ANA came positive at 1: 640 nuclear speckled and nuclear homogenous pattern.  No other suggestions were made.  She was seen by another gastroenterologist in [place] who gave her a course of antibiotics for possible SIBO and it did not improve her symptoms.  She continues to have diarrhea which she describes as 2-10 watery stools daily with occasional blood.  She states since then she has had about 3 ear infections.  She also suffers from night sweats and fatigue.  In December 2020 she sprained her right ankle and had an avulsion fracture in her ankle.  She wore a boot for 6 weeks.  The symptoms improved.  She developed her right heel pain  in August 2021.  For which she was seen by Dr. Percell Miller who diagnosed her with possible Achilles tendinitis.  She was in a boot for about 3 months without any improvement in her symptoms.  MRI of her foot has been ordered which has not been approved by  her insurance.  She continues to have pain and discomfort in her right heel.  She also complains of right carpal tunnel syndrome symptoms for the last 7 years.  She has had chronic lower back pain for the last 5 years.  She states she has flares almost every 2 months for which she gets muscle relaxers at times.  She has noticed some scabs on her lips.  She denies any history of oral ulcers or nasal ulcers.  Has history of rheumatoid arthritis in paternal grandmother.  There is no other family history of autoimmune disease.  She is gravida 1, para 1.  Activities of Daily Living:  Patient reports morning stiffness for 0 minutes.   Patient Denies nocturnal pain.  Difficulty dressing/grooming: Denies Difficulty climbing stairs: Reports Difficulty getting out of chair: Denies Difficulty using hands for taps, buttons, cutlery, and/or writing: Reports  Review of Systems  Constitutional: Positive for fatigue.  HENT: Positive for mouth dryness. Negative for mouth sores and nose dryness.   Eyes: Negative for pain, itching and dryness.  Respiratory: Negative for shortness of breath and difficulty breathing.   Cardiovascular: Negative for chest pain and palpitations.  Gastrointestinal: Positive for abdominal pain, diarrhea and nausea. Negative for blood in stool and constipation.  Endocrine: Positive for increased urination.  Genitourinary:  Positive for nocturia. Negative for difficulty urinating.  Musculoskeletal: Positive for arthralgias, joint pain, joint swelling, myalgias, muscle tenderness and myalgias. Negative for morning stiffness.  Skin: Positive for color change and sensitivity to sunlight. Negative for rash, hair loss and redness.  Allergic/Immunologic: Negative for susceptible to infections.  Neurological: Positive for dizziness and numbness. Negative for headaches, memory loss and weakness.  Hematological: Negative for bruising/bleeding tendency and swollen glands.  Psychiatric/Behavioral:  Positive for depressed mood and sleep disturbance. Negative for confusion. The patient is nervous/anxious.     PMFS History:  Patient Active Problem List   Diagnosis Date Noted  . Depression, major, single episode, moderate ([place]) 05/01/2020  . Situational anxiety 04/27/2019  . Abdominal cramping 03/12/2019  . Diarrhea 03/12/2019  . Other fatigue 03/12/2019  . Chronic diarrhea 02/03/2019  . Generalized abdominal pain 02/03/2019  . Rectal bleeding 02/03/2019  . Hemorrhoids 02/03/2019  . Exocrine pancreatic insufficiency 01/27/2019  . Change in bowel habits 12/03/2018  . Nausea without vomiting 12/03/2018  . Periumbilical abdominal pain 11/10/2018  . Non-intractable vomiting 11/10/2018  . Gastroesophageal reflux disease 05/17/2018  . Environmental allergies 05/17/2018  . Nexplanon insertion 04/01/2017    Past Medical History:  Diagnosis Date  . Anxiety 12/10/2018   on lexapro  . Depression   . Dysmenorrhea   . Exocrine pancreatic insufficiency 2020  . GERD (gastroesophageal reflux disease)   . Obesity     Family History  Problem Relation Age of Onset  . Healthy Mother   . Hypertension Father   . Healthy Father   . Healthy Sister   . ADD / ADHD Daughter   . Healthy Daughter   . Diabetes Maternal Grandfather   . Rheum arthritis Paternal Grandmother   . Colon cancer Paternal Grandfather 30  . Healthy Sister   . Esophageal cancer Neg Hx   . Inflammatory bowel disease Neg Hx   . Liver disease Neg Hx   . Pancreatic cancer Neg Hx   . Stomach cancer Neg Hx    Past Surgical History:  Procedure Laterality Date  . HAND SURGERY Left    age 25, due to injury   . OTHER SURGICAL HISTORY     Finger reattachment   Social History   Social History Narrative  . Not on file   Immunization History  Administered Date(s) Administered  . Influenza Inj Mdck Quad Pf 04/27/2018  . Influenza-Unspecified 04/23/2018, 04/03/2020  . PFIZER SARS-COV-2 Vaccination 10/13/2019, 11/10/2019       Objective: Vital Signs: BP (!) 140/98 (BP Location: Right Arm, Patient Position: Sitting, Cuff Size: Normal)   Pulse (!) 103   Resp 16   Ht $R'5\' 8"'xx$  (1.727 m)   Wt 231 lb 12.8 oz (105.1 kg)   BMI 35.25 kg/m    Physical Exam Vitals and nursing note reviewed.  Constitutional:      Appearance: She is well-developed.  HENT:     Head: Normocephalic and atraumatic.  Eyes:     Conjunctiva/sclera: Conjunctivae normal.  Cardiovascular:     Rate and Rhythm: Normal rate and regular rhythm.     Heart sounds: Normal heart sounds.  Pulmonary:     Effort: Pulmonary effort is normal.     Breath sounds: Normal breath sounds.  Abdominal:     General: Bowel sounds are normal.     Palpations: Abdomen is soft.  Musculoskeletal:     Cervical back: Normal range of motion.  Lymphadenopathy:     Cervical: No cervical adenopathy.  Skin:  General: Skin is warm and dry.     Capillary Refill: Capillary refill takes less than 2 seconds.  Neurological:     Mental Status: She is alert and oriented to person, place, and time.  Psychiatric:        Behavior: Behavior normal.      Musculoskeletal Exam: Spine was in good range of motion.  Thoracic spine was in good range of motion.  She discomfort range of motion for lumbar spine.  She had no SI joint tenderness.  Shoulder joints, elbow joints, wrist joints, MCPs PIPs and DIPs with good range of motion with no synovitis.  Hip joints, knee joints, ankles with good range of motion.  She has some tenderness over right lateral malleolus.  She also had tenderness over right Achilles tendon but not at the insertion site.  She has some calluses under her feet but no synovitis was noted over MTPs or PIPs.  CDAI Exam: CDAI Score: -- Patient Global: --; Provider Global: -- Swollen: --; Tender: -- Joint Exam 06/27/2020   No joint exam has been documented for this visit   There is currently no information documented on the homunculus. Go to the Rheumatology  activity and complete the homunculus joint exam.  Investigation: No additional findings.  Imaging: No results found.  Recent Labs: Lab Results  Component Value Date   WBC 9.8 04/30/2020   HGB 13.5 04/30/2020   PLT 376 04/30/2020   NA 138 02/10/2020   K 4.9 02/10/2020   CL 103 02/10/2020   CO2 22 02/10/2020   GLUCOSE 81 02/10/2020   BUN 13 02/10/2020   CREATININE 0.89 02/10/2020   BILITOT 0.7 06/03/2019   ALKPHOS 86 06/03/2019   AST 17 06/03/2019   ALT 17 06/03/2019   PROT 8.3 (H) 06/03/2019   ALBUMIN 4.6 06/03/2019   CALCIUM 9.5 02/10/2020   GFRAA 100 02/10/2020   January 07, 2019 celiac actually negative, February 10, 2020 TSH normal. Speciality Comments: No specialty comments available.  Procedures:  No procedures performed Allergies: Patient has no known allergies.   Assessment / Plan:     Visit Diagnoses: Positive ANA (antinuclear antibody) -she had positive ANA at [facility].  She has no clinical features of lupus.  I will obtain following labs today.  Plan: Sedimentation rate, ANA, RNP Antibody, Anti-Smith antibody, Sjogrens syndrome-A extractable nuclear antibody, Sjogrens syndrome-B extractable nuclear antibody, Anti-DNA antibody, double-stranded, C3 and C4, Anti-scleroderma antibody, Beta-2 glycoprotein antibodies, Cardiolipin antibodies, IgG, IgM, IgA, Lupus Anticoagulant Eval w/Reflex  Achilles tendinitis, right leg -she complains of discomfort in her right Achilles tendon but not at the insertion site.  She has been having difficulty walking and squatting.  She was in a boot for about 6 weeks without any improvement.  MRI of her right ankle is pending at this time.  Plan: Rheumatoid factor, Cyclic citrul peptide antibody, IgG, HLA-B27 antigen  History of fracture of right ankle-she reports having fracture of the right ankle joint in December 2020.  I have uncertain if her symptoms are related to that.  She was in the boot at that time also.  Chronic midline low back pain  without sciatica -she gives history of chronic lower back pain for the last 5 years requiring attention every 2 weeks.  She has been on muscle relaxers.  Plan: XR Lumbar Spine 2-3 Views, XR Pelvis 1-2 Views.  X-rays were consistent with facet joint arthropathy.  Mild SI joint sclerosis was noted.  Myalgia -she complains of right calf muscle pain.  Plan: CK  High risk medication use -in anticipation to start on any treatment in the future I will obtain following labs today.  Plan: Glucose 6 phosphate dehydrogenase, Hepatitis B core antibody, IgM, Hepatitis B surface antigen, Hepatitis C antibody, HIV Antibody (routine testing w rflx), QuantiFERON-TB Gold Plus, Serum protein electrophoresis with reflex, IgG, IgA, IgM  Chronic diarrhea-she gives history of watery chronic diarrhea for the last 2 years.  She has had evaluation by 3 gastroenterologist in the past.  She had colonoscopy 2 years ago which was negative for patient.  She continues to have diarrhea and occasional blood in her stool.  History of gastroesophageal reflux (GERD)-she complains of ongoing nausea.  She had endoscopy in the past.  Exocrine pancreatic insufficiency-she was treated for EPI initially as her amylase and lipase were elevated.  She was on Creon without any improvement in her symptoms.  Her repeat amylase and lipase at [facility] were normal.  Other fatigue-she complains of ongoing fatigue.  Depression, major, single episode, moderate ([facility])-she is on medications.  Orders: Orders Placed This Encounter  Procedures  . XR Lumbar Spine 2-3 Views  . XR Pelvis 1-2 Views  . Sedimentation rate  . CK  . Rheumatoid factor  . Cyclic citrul peptide antibody, IgG  . ANA  . RNP Antibody  . Anti-Smith antibody  . Sjogrens syndrome-A extractable nuclear antibody  . Sjogrens syndrome-B extractable nuclear antibody  . Anti-DNA antibody, double-stranded  . C3 and C4  . Anti-scleroderma antibody  . Beta-2 glycoprotein antibodies  .  Cardiolipin antibodies, IgG, IgM, IgA  . Lupus Anticoagulant Eval w/Reflex  . HLA-B27 antigen  . Glucose 6 phosphate dehydrogenase  . Hepatitis B core antibody, IgM  . Hepatitis B surface antigen  . Hepatitis C antibody  . HIV Antibody (routine testing w rflx)  . QuantiFERON-TB Gold Plus  . Serum protein electrophoresis with reflex  . IgG, IgA, IgM   No orders of the defined types were placed in this encounter.   Follow-Up Instructions: Return for Positive ANA, Achilles tendinitis, chronic diarrhea.   Bo Merino, MD  Note - This record has been created using Editor, commissioning.  Chart creation errors have been sought, but may not always  have been located. Such creation errors do not reflect on  the standard of medical care.

## 2020-06-27 ENCOUNTER — Ambulatory Visit: Payer: Self-pay

## 2020-06-27 ENCOUNTER — Ambulatory Visit: Payer: 59 | Admitting: Rheumatology

## 2020-06-27 ENCOUNTER — Encounter: Payer: Self-pay | Admitting: Rheumatology

## 2020-06-27 ENCOUNTER — Other Ambulatory Visit: Payer: Self-pay

## 2020-06-27 VITALS — BP 140/98 | HR 103 | Resp 16 | Ht 68.0 in | Wt 231.8 lb

## 2020-06-27 DIAGNOSIS — R768 Other specified abnormal immunological findings in serum: Secondary | ICD-10-CM | POA: Diagnosis not present

## 2020-06-27 DIAGNOSIS — M7661 Achilles tendinitis, right leg: Secondary | ICD-10-CM

## 2020-06-27 DIAGNOSIS — Z8719 Personal history of other diseases of the digestive system: Secondary | ICD-10-CM

## 2020-06-27 DIAGNOSIS — M5459 Other low back pain: Secondary | ICD-10-CM | POA: Diagnosis not present

## 2020-06-27 DIAGNOSIS — M545 Low back pain, unspecified: Secondary | ICD-10-CM

## 2020-06-27 DIAGNOSIS — R5383 Other fatigue: Secondary | ICD-10-CM

## 2020-06-27 DIAGNOSIS — G8929 Other chronic pain: Secondary | ICD-10-CM | POA: Diagnosis not present

## 2020-06-27 DIAGNOSIS — K8681 Exocrine pancreatic insufficiency: Secondary | ICD-10-CM

## 2020-06-27 DIAGNOSIS — M791 Myalgia, unspecified site: Secondary | ICD-10-CM

## 2020-06-27 DIAGNOSIS — K529 Noninfective gastroenteritis and colitis, unspecified: Secondary | ICD-10-CM

## 2020-06-27 DIAGNOSIS — Z8781 Personal history of (healed) traumatic fracture: Secondary | ICD-10-CM | POA: Diagnosis not present

## 2020-06-27 DIAGNOSIS — F321 Major depressive disorder, single episode, moderate: Secondary | ICD-10-CM

## 2020-06-27 DIAGNOSIS — Z79899 Other long term (current) drug therapy: Secondary | ICD-10-CM

## 2020-06-29 LAB — QUANTIFERON-TB GOLD PLUS
Mitogen-NIL: 3.39 IU/mL
NIL: 0.02 IU/mL
QuantiFERON-TB Gold Plus: NEGATIVE
TB1-NIL: 0 IU/mL
TB2-NIL: 0 IU/mL

## 2020-06-29 LAB — PROTEIN ELECTROPHORESIS, SERUM, WITH REFLEX
Albumin ELP: 4.3 g/dL (ref 3.8–4.8)
Alpha 1: 0.3 g/dL (ref 0.2–0.3)
Alpha 2: 0.8 g/dL (ref 0.5–0.9)
Beta 2: 0.4 g/dL (ref 0.2–0.5)
Beta Globulin: 0.6 g/dL (ref 0.4–0.6)
Gamma Globulin: 0.9 g/dL (ref 0.8–1.7)
Total Protein: 7.3 g/dL (ref 6.1–8.1)

## 2020-06-29 LAB — C3 AND C4
C3 Complement: 173 mg/dL (ref 83–193)
C4 Complement: 37 mg/dL (ref 15–57)

## 2020-06-29 LAB — HIV ANTIBODY (ROUTINE TESTING W REFLEX): HIV 1&2 Ab, 4th Generation: NONREACTIVE

## 2020-06-29 LAB — SJOGRENS SYNDROME-B EXTRACTABLE NUCLEAR ANTIBODY: SSB (La) (ENA) Antibody, IgG: <1 AI

## 2020-06-29 LAB — HLA-B27 ANTIGEN: HLA-B27 Antigen: NEGATIVE

## 2020-06-29 LAB — LUPUS ANTICOAGULANT EVAL W/ REFLEX
PTT-LA Screen: 34 s (ref <=40)
dRVVT: 37 s (ref <=45)

## 2020-06-29 LAB — CK: Total CK: 49 U/L (ref 29–143)

## 2020-06-29 LAB — HEPATITIS B CORE ANTIBODY, IGM: Hep B C IgM: NONREACTIVE

## 2020-06-29 LAB — HEPATITIS B SURFACE ANTIGEN: Hepatitis B Surface Ag: NONREACTIVE

## 2020-06-29 LAB — CARDIOLIPIN ANTIBODIES, IGG, IGM, IGA
Anticardiolipin IgA: <2 APL-U/mL
Anticardiolipin IgG: <2 GPL-U/mL
Anticardiolipin IgM: <2 MPL-U/mL

## 2020-06-29 LAB — BETA-2 GLYCOPROTEIN ANTIBODIES
Beta-2 Glyco 1 IgA: <2 U/mL
Beta-2 Glyco 1 IgM: <2 U/mL
Beta-2 Glyco I IgG: <2 U/mL

## 2020-06-29 LAB — HEPATITIS C ANTIBODY
Hepatitis C Ab: NONREACTIVE
SIGNAL TO CUT-OFF: 0.01 (ref <1.00)

## 2020-06-29 LAB — ANTI-NUCLEAR AB-TITER (ANA TITER): ANA Titer 1: 1:80 {titer} — ABNORMAL HIGH

## 2020-06-29 LAB — RHEUMATOID FACTOR: Rheumatoid fact SerPl-aCnc: <14 IU/mL (ref <14)

## 2020-06-29 LAB — ANTI-SCLERODERMA ANTIBODY: Scleroderma (Scl-70) (ENA) Antibody, IgG: <1 AI

## 2020-06-29 LAB — ANTI-DNA ANTIBODY, DOUBLE-STRANDED: ds DNA Ab: 1 IU/mL

## 2020-06-29 LAB — SJOGRENS SYNDROME-A EXTRACTABLE NUCLEAR ANTIBODY: SSA (Ro) (ENA) Antibody, IgG: <1 AI

## 2020-06-29 LAB — CYCLIC CITRUL PEPTIDE ANTIBODY, IGG: Cyclic Citrullin Peptide Ab: <16 UNITS

## 2020-06-29 LAB — ANTI-SMITH ANTIBODY: ENA SM Ab Ser-aCnc: <1 AI

## 2020-06-29 LAB — IGG, IGA, IGM
IgG (Immunoglobin G), Serum: 1076 mg/dL (ref 600–1640)
IgM, Serum: 102 mg/dL (ref 50–300)
Immunoglobulin A: 159 mg/dL (ref 47–310)

## 2020-06-29 LAB — SEDIMENTATION RATE: Sed Rate: 9 mm/h (ref 0–20)

## 2020-06-29 LAB — ANA: Anti Nuclear Antibody (ANA): POSITIVE — AB

## 2020-06-29 LAB — RNP ANTIBODY: Ribonucleic Protein(ENA) Antibody, IgG: <1 AI

## 2020-06-29 LAB — GLUCOSE 6 PHOSPHATE DEHYDROGENASE: G-6PDH: 17.5 U/g Hgb (ref 7.0–20.5)

## 2020-06-29 NOTE — Progress Notes (Signed)
I will discuss results at the follow-up visit.

## 2020-07-01 ENCOUNTER — Other Ambulatory Visit: Payer: Self-pay | Admitting: Gastroenterology

## 2020-07-01 DIAGNOSIS — R1033 Periumbilical pain: Secondary | ICD-10-CM

## 2020-07-04 NOTE — Progress Notes (Signed)
Office Visit Note  Patient: Wanda Acosta             Date of Birth: [birth year]           MRN: [id number]             PCP: Shelda Pal, DO Referring: Shelda Pal* Visit Date: 07/17/2020 Occupation: @GUAROCC @  Subjective:  Fatigue, diarrhea.   History of Present Illness: Wanda Acosta is a 29 y.o. female with history of fatigue and chronic diarrhea.  She has had work-up by 3 GI physicians without any conclusive diagnosis.  She also has exocrine pancreatic insufficiency for which she has been taking Creon.  She was seen last on June 27, 2020 with history of right Achilles tendinitis.  She was having difficulty walking.  The MRI of the right ankle joint is a still pending.  She also has history of fracture in the right ankle.  She complained of lower back pain and the x-rays were consistent with facet joint arthropathy.  Activities of Daily Living:  Patient reports morning stiffness for 2 minutes.   Patient Denies nocturnal pain.  Difficulty dressing/grooming: Denies Difficulty climbing stairs: Reports Difficulty getting out of chair: Denies Difficulty using hands for taps, buttons, cutlery, and/or writing: Reports  Review of Systems  Constitutional: Positive for fatigue. Negative for night sweats, weight gain and weight loss.  HENT: Positive for mouth dryness. Negative for mouth sores, trouble swallowing, trouble swallowing and nose dryness.   Eyes: Negative for pain, redness, visual disturbance and dryness.  Respiratory: Negative for cough, shortness of breath and difficulty breathing.   Cardiovascular: Negative for chest pain, palpitations, hypertension, irregular heartbeat and swelling in legs/feet.  Gastrointestinal: Positive for diarrhea, nausea and vomiting. Negative for blood in stool and constipation.  Endocrine: Negative for increased urination.  Genitourinary: Negative for vaginal dryness.  Musculoskeletal: Positive for arthralgias, joint pain and  morning stiffness. Negative for joint swelling, myalgias, muscle weakness, muscle tenderness and myalgias.  Skin: Negative for color change, rash, hair loss, skin tightness, ulcers and sensitivity to sunlight.  Allergic/Immunologic: Negative for susceptible to infections.  Neurological: Negative for dizziness, memory loss, night sweats and weakness.  Hematological: Negative for swollen glands.  Psychiatric/Behavioral: Positive for depressed mood and sleep disturbance. The patient is nervous/anxious.     PMFS History:  Patient Active Problem List   Diagnosis Date Noted  . Depression, major, single episode, moderate ([facility]) 05/01/2020  . Situational anxiety 04/27/2019  . Abdominal cramping 03/12/2019  . Diarrhea 03/12/2019  . Other fatigue 03/12/2019  . Chronic diarrhea 02/03/2019  . Generalized abdominal pain 02/03/2019  . Rectal bleeding 02/03/2019  . Hemorrhoids 02/03/2019  . Exocrine pancreatic insufficiency 01/27/2019  . Change in bowel habits 12/03/2018  . Nausea without vomiting 12/03/2018  . Periumbilical abdominal pain 11/10/2018  . Non-intractable vomiting 11/10/2018  . Gastroesophageal reflux disease 05/17/2018  . Environmental allergies 05/17/2018  . Nexplanon insertion 04/01/2017    Past Medical History:  Diagnosis Date  . Anxiety 12/10/2018   on lexapro  . Depression   . Dysmenorrhea   . Exocrine pancreatic insufficiency 2020  . GERD (gastroesophageal reflux disease)   . Obesity     Family History  Problem Relation Age of Onset  . Healthy Mother   . Hypertension Father   . Healthy Father   . Healthy Sister   . ADD / ADHD Daughter   . Healthy Daughter   . Diabetes Maternal Grandfather   . Rheum arthritis Paternal Grandmother   .  Colon cancer Paternal Grandfather 28  . Healthy Sister   . Esophageal cancer Neg Hx   . Inflammatory bowel disease Neg Hx   . Liver disease Neg Hx   . Pancreatic cancer Neg Hx   . Stomach cancer Neg Hx    Past Surgical  History:  Procedure Laterality Date  . HAND SURGERY Left    age 41, due to injury   . OTHER SURGICAL HISTORY     Finger reattachment   Social History   Social History Narrative  . Not on file   Immunization History  Administered Date(s) Administered  . Influenza Inj Mdck Quad Pf 04/27/2018  . Influenza-Unspecified 04/23/2018, 04/03/2020  . PFIZER SARS-COV-2 Vaccination 10/13/2019, 11/10/2019     Objective: Vital Signs: BP 136/87 (BP Location: Right Arm, Patient Position: Sitting, Cuff Size: Large)   Pulse (!) 105   Resp 13   Ht 5\' 8"  (1.727 m)   Wt 231 lb (104.8 kg)   LMP 07/10/2020 (Approximate)   BMI 35.12 kg/m    Physical Exam Vitals and nursing note reviewed.  Constitutional:      Appearance: She is well-developed and well-nourished.  HENT:     Head: Normocephalic and atraumatic.  Eyes:     Extraocular Movements: EOM normal.     Conjunctiva/sclera: Conjunctivae normal.  Cardiovascular:     Rate and Rhythm: Normal rate and regular rhythm.     Pulses: Intact distal pulses.     Heart sounds: Normal heart sounds.  Pulmonary:     Effort: Pulmonary effort is normal.     Breath sounds: Normal breath sounds.  Abdominal:     General: Bowel sounds are normal.     Palpations: Abdomen is soft.  Musculoskeletal:     Cervical back: Normal range of motion.  Lymphadenopathy:     Cervical: No cervical adenopathy.  Skin:    General: Skin is warm and dry.     Capillary Refill: Capillary refill takes less than 2 seconds.     Comments:   Nail dystrophy was noted in her left great toe and second toe.  Which she relates to previous fungal infection.   Neurological:     Mental Status: She is alert and oriented to person, place, and time.  Psychiatric:        Mood and Affect: Mood and affect normal.        Behavior: Behavior normal.      Musculoskeletal Exam: C-spine thoracic and lumbar spine with good range of motion.  She had tenderness over bilateral SI joints.  Shoulder  joints, elbow joints, wrist joints, MCPs PIPs and DIPs with good range of motion with no synovitis.  Hip joints, knee joints, ankles with good range of motion.  She has tenderness on the medial lateral aspect of the ankle.  There was no tenderness over plantar fascial Achilles tendon.  No synovitis was noted over MCPs PIPs or DIPs. CDAI Exam: CDAI Score: -- Patient Global: --; Provider Global: -- Swollen: 0 ; Tender: 0  Joint Exam 07/17/2020   No joint exam has been documented for this visit   There is currently no information documented on the homunculus. Go to the Rheumatology activity and complete the homunculus joint exam.  Investigation: No additional findings.  Imaging: XR Lumbar Spine 2-3 Views  Result Date: 06/27/2020 No significant disc space narrowing was noted.  No syndesmophytes were noted.  No anterior spurring was noted.  No SI joint changes were noted.  Facet joint arthropathy was  noted. Impression: These findings are consistent with lumbar facet joint arthropathy.  XR Pelvis 1-2 Views  Result Date: 06/27/2020 No SI joint narrowing was noted.  Possible SI joint sclerosis was noted. Impression: Mild SI joint sclerosis was noted.   Recent Labs: Lab Results  Component Value Date   WBC 9.8 04/30/2020   HGB 13.5 04/30/2020   PLT 376 04/30/2020   NA 138 02/10/2020   K 4.9 02/10/2020   CL 103 02/10/2020   CO2 22 02/10/2020   GLUCOSE 81 02/10/2020   BUN 13 02/10/2020   CREATININE 0.89 02/10/2020   BILITOT 0.7 06/03/2019   ALKPHOS 86 06/03/2019   AST 17 06/03/2019   ALT 17 06/03/2019   PROT 7.3 06/27/2020   ALBUMIN 4.6 06/03/2019   CALCIUM 9.5 02/10/2020   GFRAA 100 02/10/2020   QFTBGOLDPLUS NEGATIVE 06/27/2020   June 27, 2020 SPEP normal, immunoglobulins normal, TB Gold negative, hepatitis B-, hepatitis C negative, HIV negative, G6PD normal, CK 49, ESR 9  Lupus anticoagulant negative, beta-2 GP 1 -, anticardiolipin antibody negative, C3-C4 normal, ANA 1: 80  speckled, ENA negative, RF negative, anti-CCP negative, HLA-B27 negative  Speciality Comments: No specialty comments available.  Procedures:  No procedures performed Allergies: Patient has no known allergies.   Assessment / Plan:     Visit Diagnoses: Positive ANA (antinuclear antibody) - ANA is low titer positive, ENA negative, no clinical features of lupus or related autoimmune disease.  Chronic SI joint pain -patient complains of significant discomfort in her SI joints and morning stiffness.  She had mild sclerosis on the x-rays.  I will obtain an MRI to evaluate this further.  Plan: MR SACRUM SI JOINTS WO CONTRAST  Arthropathy of lumbar facet joint-she has some lumbar facet joint arthropathy which causes lower back discomfort.  Achilles tendinitis, right leg - Treated with a boot for 6 weeks without improvement.  HLA-B27 negative.  MRI of the ankle pending.  I did not notice any Achilles tendinitis today or on the previous visit.  History of fracture of right ankle - In December 2020.  She had to wear a boot at the time.  She continues to have some ankle joint pain and tenderness.  Chronic diarrhea - History of diarrhea for the last 2 years.  She had evaluation by 3 gastroenterologist.  Colonoscopy -2 years ago per patient. h/o occasional blood in the stool.  She continues to have diarrhea on a daily basis.  She states she is very frustrated about the diarrhea situation.  She has urgency, fecal incontinence at times and up to 10 loose stools a day.  She also suffers from nausea and sometimes vomiting.  History of gastroesophageal reflux (GERD)  Exocrine pancreatic insufficiency - She is on Creon without any improvement in her symptoms.  Depression, major, single episode, moderate ([facility])-related to chronic diarrhea per patient.  Orders: Orders Placed This Encounter  Procedures  . MR SACRUM SI JOINTS WO CONTRAST   No orders of the defined types were placed in this  encounter.   Follow-Up Instructions: Return in about 3 weeks (around 08/07/2020) for Joint pain.   Bo Merino, MD  Note - This record has been created using Editor, commissioning.  Chart creation errors have been sought, but may not always  have been located. Such creation errors do not reflect on  the standard of medical care.

## 2020-07-17 ENCOUNTER — Other Ambulatory Visit: Payer: Self-pay

## 2020-07-17 ENCOUNTER — Encounter: Payer: Self-pay | Admitting: Rheumatology

## 2020-07-17 ENCOUNTER — Ambulatory Visit (INDEPENDENT_AMBULATORY_CARE_PROVIDER_SITE_OTHER): Payer: PRIVATE HEALTH INSURANCE | Admitting: Rheumatology

## 2020-07-17 VITALS — BP 136/87 | HR 105 | Resp 13 | Ht 68.0 in | Wt 231.0 lb

## 2020-07-17 DIAGNOSIS — R768 Other specified abnormal immunological findings in serum: Secondary | ICD-10-CM | POA: Diagnosis not present

## 2020-07-17 DIAGNOSIS — M47816 Spondylosis without myelopathy or radiculopathy, lumbar region: Secondary | ICD-10-CM

## 2020-07-17 DIAGNOSIS — G8929 Other chronic pain: Secondary | ICD-10-CM

## 2020-07-17 DIAGNOSIS — K529 Noninfective gastroenteritis and colitis, unspecified: Secondary | ICD-10-CM

## 2020-07-17 DIAGNOSIS — M533 Sacrococcygeal disorders, not elsewhere classified: Secondary | ICD-10-CM

## 2020-07-17 DIAGNOSIS — M7661 Achilles tendinitis, right leg: Secondary | ICD-10-CM

## 2020-07-17 DIAGNOSIS — Z8719 Personal history of other diseases of the digestive system: Secondary | ICD-10-CM

## 2020-07-17 DIAGNOSIS — Z8781 Personal history of (healed) traumatic fracture: Secondary | ICD-10-CM

## 2020-07-17 DIAGNOSIS — K8681 Exocrine pancreatic insufficiency: Secondary | ICD-10-CM

## 2020-07-17 DIAGNOSIS — F321 Major depressive disorder, single episode, moderate: Secondary | ICD-10-CM

## 2020-07-18 ENCOUNTER — Encounter: Payer: Self-pay | Admitting: Rheumatology

## 2020-07-18 DIAGNOSIS — N949 Unspecified condition associated with female genital organs and menstrual cycle: Secondary | ICD-10-CM

## 2020-07-18 HISTORY — DX: Unspecified condition associated with female genital organs and menstrual cycle: N94.9

## 2020-07-25 ENCOUNTER — Telehealth: Payer: Self-pay

## 2020-07-25 NOTE — Telephone Encounter (Signed)
MRI Results received via fax.   MRI performed on 07/18/2020 of pelvis   Results reviewed by Sherron Ales, PA-C and noted: normal appearing bilateral SI joints. Right ovarian cyst noted.   I advised patient of results and she verbalized understanding. MRI report has been faxed to OB-GYN and patient will follow up with them in regards to the ovarian cyst. Will send document to scan center.

## 2020-08-01 ENCOUNTER — Other Ambulatory Visit: Payer: Self-pay | Admitting: Family Medicine

## 2020-08-01 DIAGNOSIS — F411 Generalized anxiety disorder: Secondary | ICD-10-CM

## 2020-08-06 ENCOUNTER — Other Ambulatory Visit: Payer: Self-pay

## 2020-08-06 ENCOUNTER — Telehealth (INDEPENDENT_AMBULATORY_CARE_PROVIDER_SITE_OTHER): Payer: PRIVATE HEALTH INSURANCE | Admitting: Family Medicine

## 2020-08-06 ENCOUNTER — Encounter: Payer: Self-pay | Admitting: Family Medicine

## 2020-08-06 VITALS — Temp 101.0°F | Ht 68.0 in | Wt 230.0 lb

## 2020-08-06 DIAGNOSIS — R519 Headache, unspecified: Secondary | ICD-10-CM | POA: Diagnosis not present

## 2020-08-06 MED ORDER — TRAMADOL HCL 50 MG PO TABS: 50.0000 mg | ORAL_TABLET | Freq: Three times a day (TID) | ORAL | 0 refills | Status: AC | PRN

## 2020-08-06 MED ORDER — CETIRIZINE HCL 10 MG PO TABS: 10.0000 mg | ORAL_TABLET | Freq: Every day | ORAL | 11 refills | Status: DC

## 2020-08-06 NOTE — Progress Notes (Signed)
Virtual Visit via Video Note  I connected with Wanda Acosta on 08/06/20 at  4:00 PM EST by a video enabled telemedicine application and verified that I am speaking with the correct person using two identifiers.  Location: Patient: home alone  Provider: office    I discussed the limitations of evaluation and management by telemedicine and the availability of in person appointments. The patient expressed understanding and agreed to proceed.  History of Present Illness: Pt is home c/o headache, fatigue and congestionx 5 days  ---  Pt is taking ib, tylenol severe sinus       Nothing is helping her headache She had a covid test done sat and is still waiting for the results    No body aches    Past Medical History:  Diagnosis Date  . Anxiety 12/10/2018   on lexapro  . Depression   . Dysmenorrhea   . Exocrine pancreatic insufficiency 2020  . GERD (gastroesophageal reflux disease)   . Obesity    Current Outpatient Medications on File Prior to Visit  Medication Sig Dispense Refill  . buPROPion (WELLBUTRIN XL) 150 MG 24 hr tablet Take 1 tablet (150 mg total) by mouth daily. 30 tablet 3  . clonazePAM (KLONOPIN) 0.5 MG tablet Take 1 tablet (0.5 mg total) by mouth 2 (two) times daily as needed for anxiety. 30 tablet 0  . etonogestrel (NEXPLANON) 68 MG IMPL implant 1 each by Subdermal route once.    . IBUPROFEN PO Take by mouth as needed.    . Loperamide HCl (IMODIUM PO) Take by mouth daily.    . meloxicam (MOBIC) 15 MG tablet Take 1 tablet (15 mg total) by mouth daily. 30 tablet 0  . mirtazapine (REMERON) 30 MG tablet Take 1 tablet (30 mg total) by mouth at bedtime. 30 tablet 1  . ondansetron (ZOFRAN ODT) 8 MG disintegrating tablet Take 1 tablet (8 mg total) by mouth every 8 (eight) hours as needed for nausea, vomiting or refractory nausea / vomiting. 30 tablet 3  . pantoprazole (PROTONIX) 40 MG tablet TAKE 1 TABLET BY MOUTH EVERY DAY 30 tablet 1  . venlafaxine XR (EFFEXOR-XR) 150 MG 24 hr  capsule TAKE 1 CAPSULE BY MOUTH EVERY DAY WITH BREAKFAST 30 capsule 3   No current facility-administered medications on file prior to visit.    Observations/Objective:  Vitals:   08/06/20 1604  Temp: (!) 101 F (38.3 C)   ptis in NAD  No sob   Assessment and Plan: 1. Headache above the eye region Tramadol small am to try to knock out the headache Pt will check bp when her husband gets home-- they have a manual cuff--- she will call us tomorrow with the reading  If it is high pt advised not to take decongestants, Ibuprofen or excedrin migraine 'she will call us with covid results ? Flu but pt has no other symptoms other than fever --- symptoms >48h  - traMADol (ULTRAM) 50 MG tablet; Take 1 tablet (50 mg total) by mouth every 8 (eight) hours as needed for up to 5 days.  Dispense: 15 tablet; Refill: 0 If covid Positive -- consider covid clinic if symptoms do not improve   Follow Up Instructions:    I discussed the assessment and treatment plan with the patient. The patient was provided an opportunity to ask questions and all were answered. The patient agreed with the plan and demonstrated an understanding of the instructions.   The patient was advised to call back or seek  an in-person evaluation if the symptoms worsen or if the condition fails to improve as anticipated.  I provided 25 minutes of non-face-to-face time during this encounter.   Ann Held, DO

## 2020-08-08 ENCOUNTER — Telehealth: Payer: Self-pay | Admitting: Obstetrics and Gynecology

## 2020-08-08 ENCOUNTER — Other Ambulatory Visit: Payer: Self-pay

## 2020-08-08 DIAGNOSIS — N83201 Unspecified ovarian cyst, right side: Secondary | ICD-10-CM

## 2020-08-08 NOTE — Telephone Encounter (Signed)
Please contact patient in follow up to an MRI report I received from Dr. Estanislado Pandy regarding a 3.7 cm right ovarian cyst seen on her pelvic MRI 07/18/20.   I would like for her to have a pelvic ultrasound and office visit with me around Feb 2.   If she is having right pelvic pain, I recommend that I see her sooner.   This ultrasound will need to go to precert.

## 2020-08-08 NOTE — Telephone Encounter (Signed)
I spoke with patient and informed her. Order placed and routed to [facility] for precert.

## 2020-08-14 NOTE — Telephone Encounter (Signed)
Call to patient. Per DPR, OK to leave message on voicemail.   Left voicemail requesting a return call to [facility] to schedule recommended Pelvic ultrasound with Brook A. Quincy Simmonds, MD, Cherlynn June

## 2020-09-04 ENCOUNTER — Ambulatory Visit: Payer: PRIVATE HEALTH INSURANCE | Admitting: Rheumatology

## 2020-09-04 DIAGNOSIS — Z8781 Personal history of (healed) traumatic fracture: Secondary | ICD-10-CM

## 2020-09-04 DIAGNOSIS — F321 Major depressive disorder, single episode, moderate: Secondary | ICD-10-CM

## 2020-09-04 DIAGNOSIS — M47816 Spondylosis without myelopathy or radiculopathy, lumbar region: Secondary | ICD-10-CM

## 2020-09-04 DIAGNOSIS — K8681 Exocrine pancreatic insufficiency: Secondary | ICD-10-CM

## 2020-09-04 DIAGNOSIS — Z8719 Personal history of other diseases of the digestive system: Secondary | ICD-10-CM

## 2020-09-04 DIAGNOSIS — K529 Noninfective gastroenteritis and colitis, unspecified: Secondary | ICD-10-CM

## 2020-09-04 DIAGNOSIS — M7661 Achilles tendinitis, right leg: Secondary | ICD-10-CM

## 2020-09-04 DIAGNOSIS — G8929 Other chronic pain: Secondary | ICD-10-CM

## 2020-09-04 DIAGNOSIS — R768 Other specified abnormal immunological findings in serum: Secondary | ICD-10-CM

## 2020-09-05 ENCOUNTER — Other Ambulatory Visit: Payer: Self-pay | Admitting: Family Medicine

## 2020-09-05 ENCOUNTER — Other Ambulatory Visit: Payer: Self-pay | Admitting: Gastroenterology

## 2020-09-05 DIAGNOSIS — R1033 Periumbilical pain: Secondary | ICD-10-CM

## 2020-09-05 DIAGNOSIS — F418 Other specified anxiety disorders: Secondary | ICD-10-CM

## 2020-09-05 DIAGNOSIS — F321 Major depressive disorder, single episode, moderate: Secondary | ICD-10-CM

## 2020-09-27 ENCOUNTER — Other Ambulatory Visit: Payer: Self-pay | Admitting: Family Medicine

## 2020-09-27 DIAGNOSIS — F418 Other specified anxiety disorders: Secondary | ICD-10-CM

## 2020-09-27 NOTE — Telephone Encounter (Signed)
Requesting: clonazepam 0.5mg  Contract: None UDS: None Last Visit: 08/06/2020 w/ Etter Sjogren Next Visit: None Last Refill: 05/21/2020 #30 and 0RF  Please Advise

## 2020-10-07 ENCOUNTER — Encounter: Payer: Self-pay | Admitting: Obstetrics and Gynecology

## 2020-10-07 ENCOUNTER — Telehealth: Payer: Self-pay | Admitting: Obstetrics and Gynecology

## 2020-10-07 DIAGNOSIS — N83201 Unspecified ovarian cyst, right side: Secondary | ICD-10-CM

## 2020-10-07 NOTE — Telephone Encounter (Signed)
Please contact patient about scheduling a pelvic ultrasound and follow up with me follow up on the 3.7 cm right adnexal cyst noted on her MRI from 07/18/20.

## 2020-10-08 NOTE — Telephone Encounter (Signed)
Left message for patient to call.

## 2020-10-10 NOTE — Telephone Encounter (Signed)
Called patient again and received voicemail, I left a detailed message on voicemail per DPR access to call and schedule ultrasound, order placed.

## 2020-10-30 IMAGING — US US EXTREM LOW VENOUS*R*
1 series · 14 of 24 positions shown · non-contrast
Comparison: None.

CLINICAL DATA: Right calf pain

EXAM:
RIGHT LOWER EXTREMITY VENOUS DOPPLER ULTRASOUND
TECHNIQUE: Gray-scale sonography with compression, as well as color and duplex
ultrasound, were performed to evaluate the deep venous system(s)
from the level of the common femoral vein through the popliteal and
proximal calf veins.

[Series 1: us venous img lower uni right (dvt) · portal-venous · 14 of 34 slices shown]
[im 1/34]
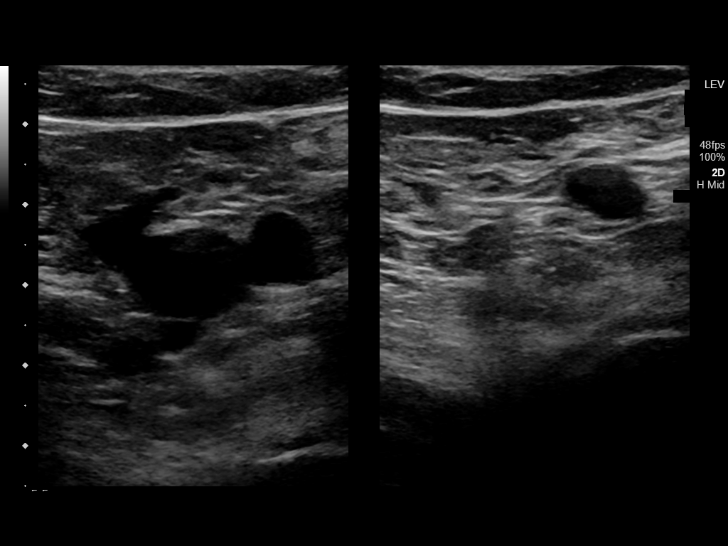
[im 3/34]
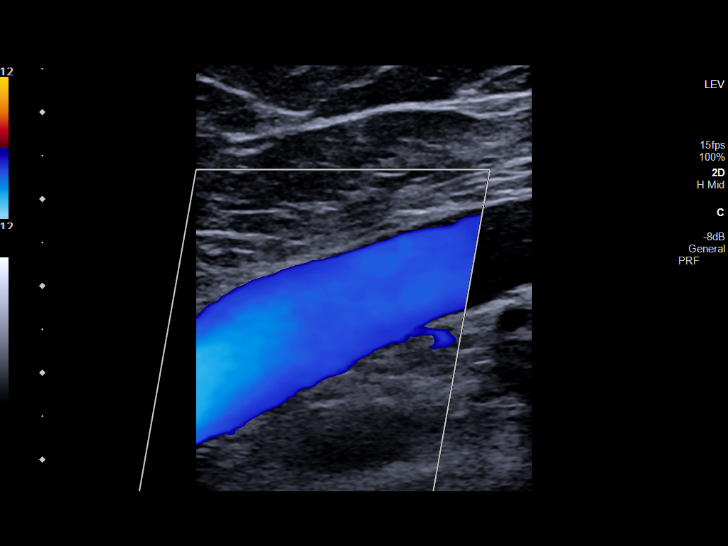
[im 6/34]
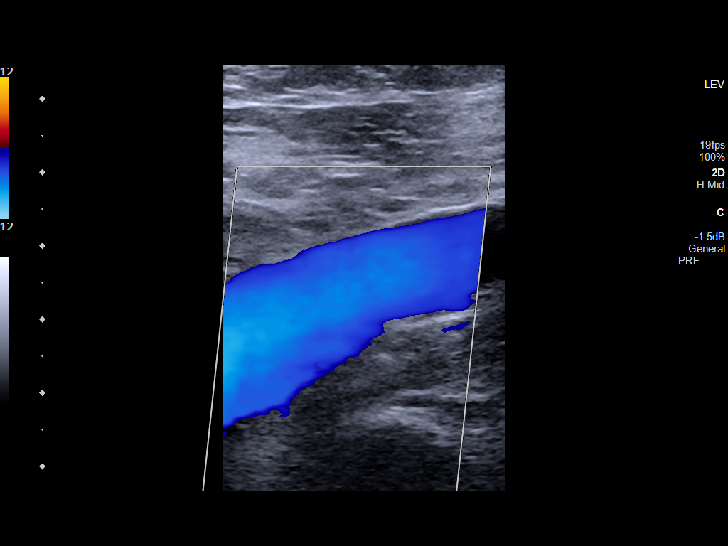
[im 9/34]
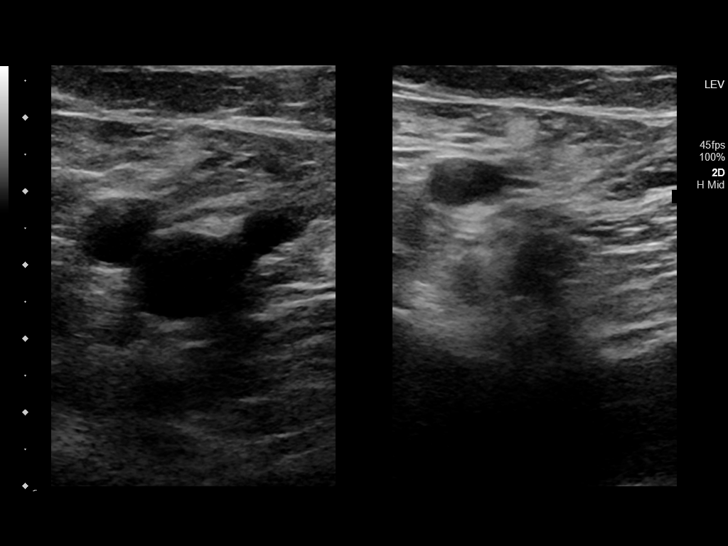
[im 11/34]
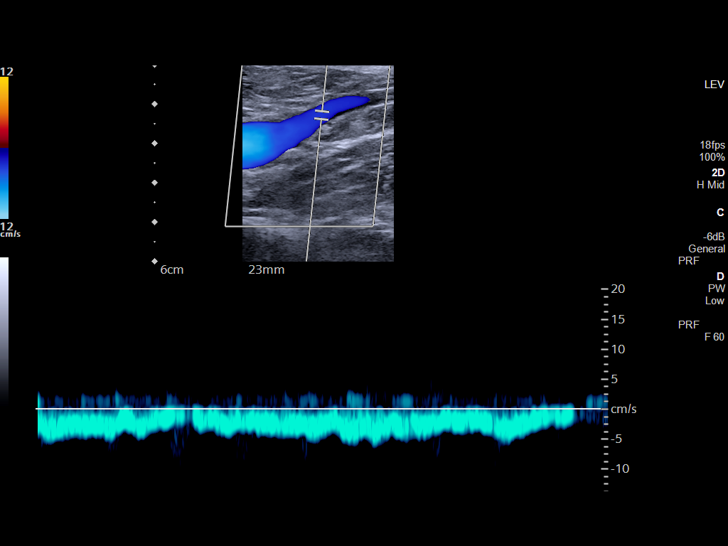
[im 13/34]
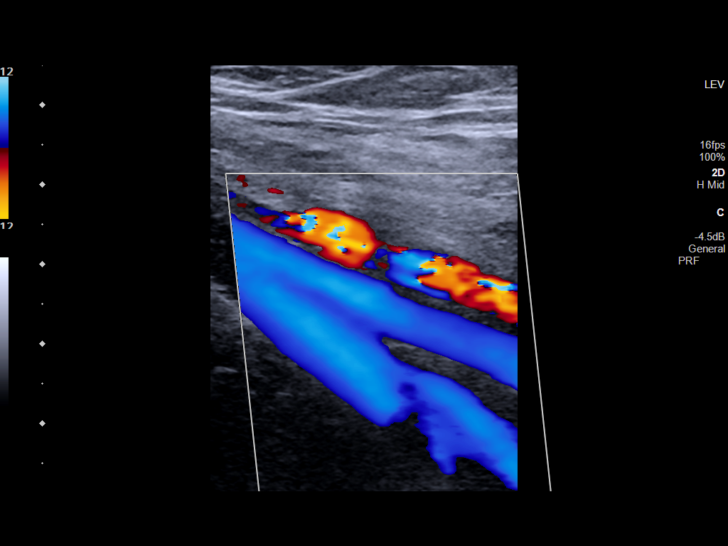
[im 16/34]
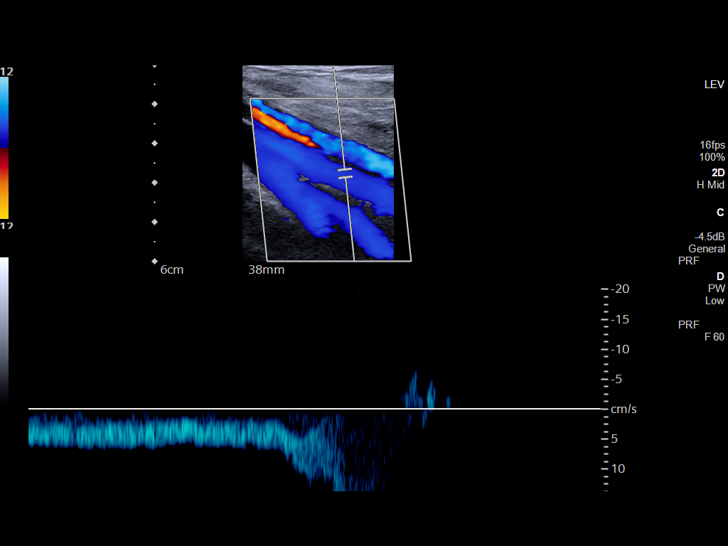
[im 18/34]
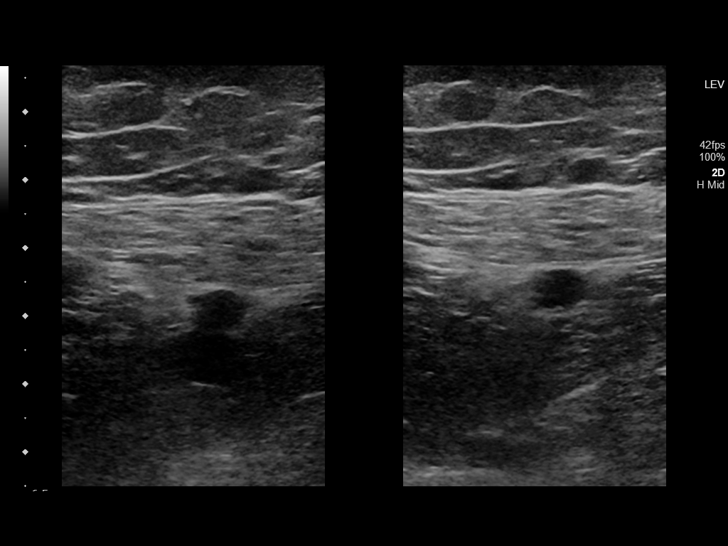
[im 21/34]
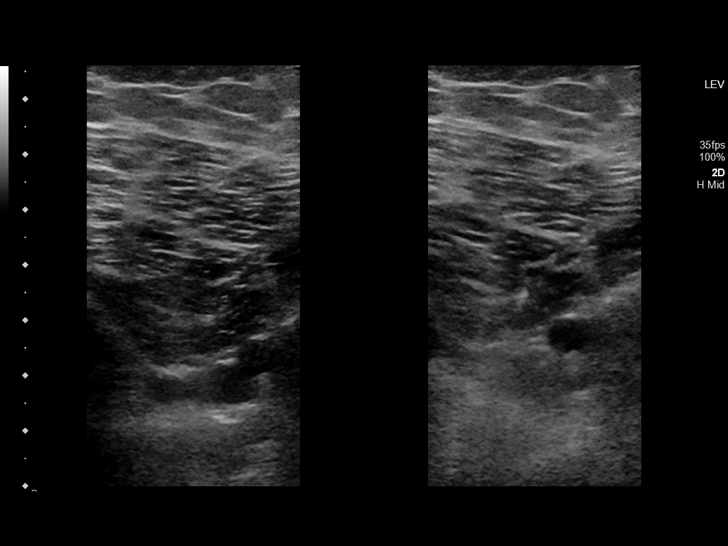
[im 23/34]
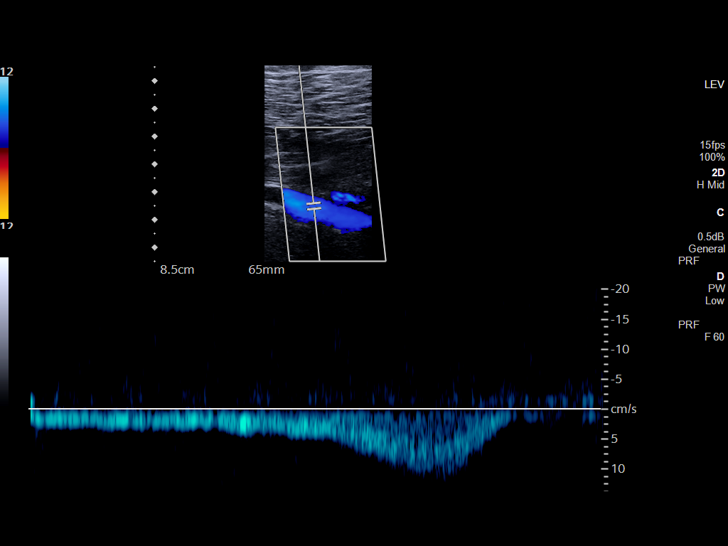
[im 26/34]
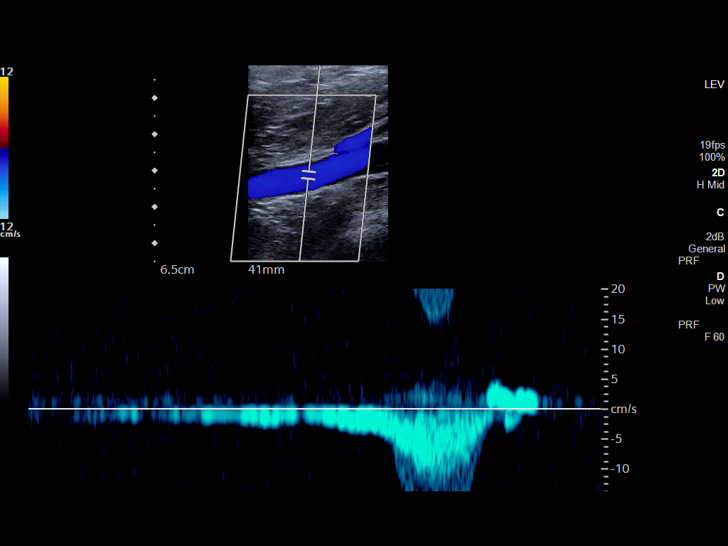
[im 28/34]
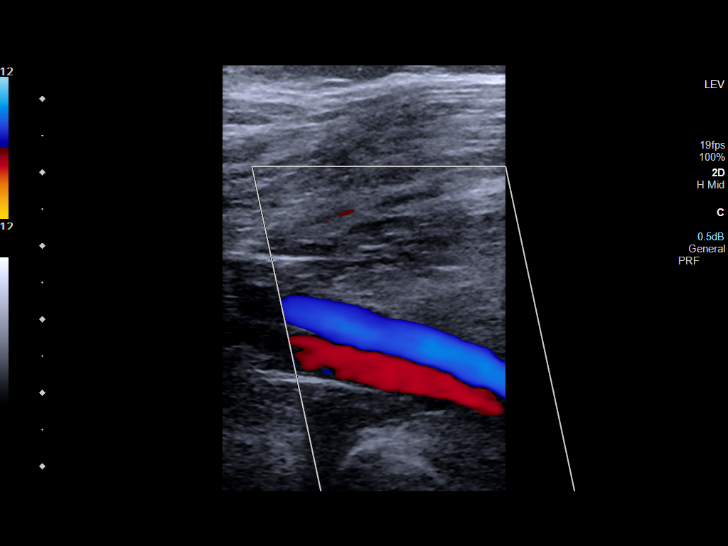
[im 31/34]
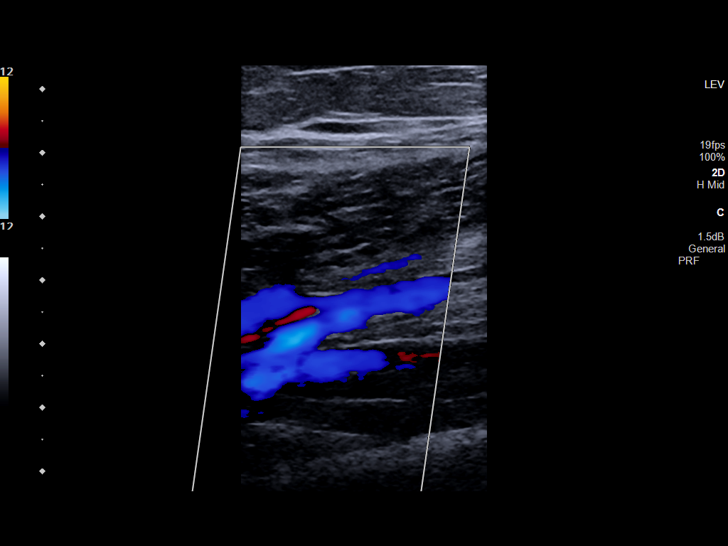
[im 34/34]
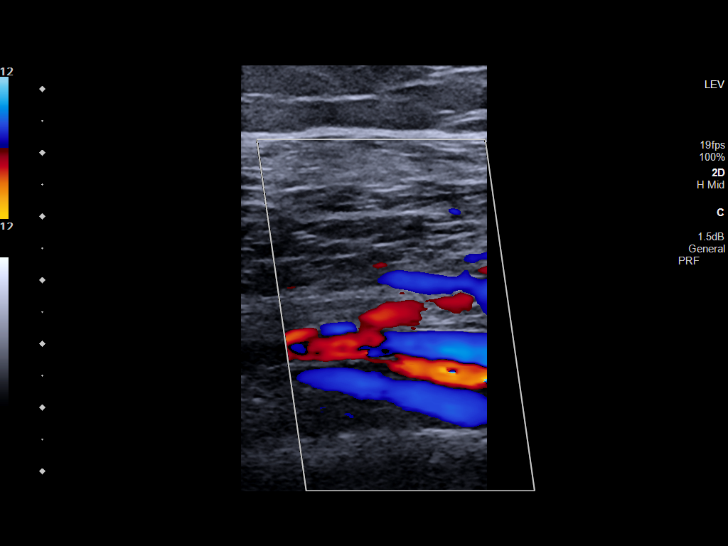

[14 of 24 positions shown; findings below may reference images not displayed]

FINDINGS: VENOUS

Normal compressibility of the common femoral, superficial femoral,
and popliteal veins, as well as the visualized calf veins.
Visualized portions of profunda femoral vein and great saphenous
vein unremarkable. No filling defects to suggest DVT on grayscale or
color Doppler imaging. Doppler waveforms show normal direction of
venous flow, normal respiratory plasticity and response to
augmentation.

Limited views of the contralateral common femoral vein are
unremarkable.

OTHER

None.

Limitations: none
IMPRESSION: Negative.

## 2020-11-03 ENCOUNTER — Other Ambulatory Visit: Payer: Self-pay | Admitting: Family Medicine

## 2020-11-03 ENCOUNTER — Other Ambulatory Visit: Payer: Self-pay | Admitting: Gastroenterology

## 2020-11-03 DIAGNOSIS — R1033 Periumbilical pain: Secondary | ICD-10-CM

## 2020-11-03 DIAGNOSIS — F418 Other specified anxiety disorders: Secondary | ICD-10-CM

## 2020-12-22 ENCOUNTER — Other Ambulatory Visit: Payer: Self-pay | Admitting: Gastroenterology

## 2020-12-22 ENCOUNTER — Other Ambulatory Visit: Payer: Self-pay | Admitting: Family Medicine

## 2020-12-22 DIAGNOSIS — R1033 Periumbilical pain: Secondary | ICD-10-CM

## 2020-12-22 DIAGNOSIS — F418 Other specified anxiety disorders: Secondary | ICD-10-CM

## 2020-12-24 ENCOUNTER — Other Ambulatory Visit: Payer: Self-pay | Admitting: Family Medicine

## 2020-12-24 DIAGNOSIS — F411 Generalized anxiety disorder: Secondary | ICD-10-CM

## 2021-03-22 ENCOUNTER — Other Ambulatory Visit: Payer: Self-pay | Admitting: Gastroenterology

## 2021-03-22 ENCOUNTER — Other Ambulatory Visit: Payer: Self-pay | Admitting: Family Medicine

## 2021-03-22 DIAGNOSIS — R1033 Periumbilical pain: Secondary | ICD-10-CM

## 2021-03-22 DIAGNOSIS — F418 Other specified anxiety disorders: Secondary | ICD-10-CM

## 2021-03-26 ENCOUNTER — Encounter: Payer: Self-pay | Admitting: Obstetrics and Gynecology

## 2021-03-26 ENCOUNTER — Ambulatory Visit (INDEPENDENT_AMBULATORY_CARE_PROVIDER_SITE_OTHER): Payer: No Typology Code available for payment source

## 2021-03-26 ENCOUNTER — Ambulatory Visit (INDEPENDENT_AMBULATORY_CARE_PROVIDER_SITE_OTHER): Payer: No Typology Code available for payment source | Admitting: Obstetrics and Gynecology

## 2021-03-26 ENCOUNTER — Other Ambulatory Visit: Payer: Self-pay

## 2021-03-26 VITALS — BP 112/64 | HR 97 | Ht 68.0 in

## 2021-03-26 DIAGNOSIS — N83201 Unspecified ovarian cyst, right side: Secondary | ICD-10-CM | POA: Diagnosis not present

## 2021-03-26 DIAGNOSIS — Z8742 Personal history of other diseases of the female genital tract: Secondary | ICD-10-CM

## 2021-03-26 DIAGNOSIS — Z975 Presence of (intrauterine) contraceptive device: Secondary | ICD-10-CM | POA: Diagnosis not present

## 2021-03-26 DIAGNOSIS — N926 Irregular menstruation, unspecified: Secondary | ICD-10-CM

## 2021-03-26 MED ORDER — ESTRADIOL 0.5 MG PO TABS: 0.5000 mg | ORAL_TABLET | Freq: Every day | ORAL | 0 refills | Status: DC

## 2021-03-26 NOTE — Progress Notes (Signed)
GYNECOLOGY  VISIT   HPI: 29 y.o.   Married  Caucasian  female   G1P1 with No LMP recorded. Patient has had an implant.   here for pelvic ultrasound. Patient having constant spotting with Nexplanon now for the last 2 - 3 months.  Menses can last 7 - 10 days and in between she has discharge like the end of her period.   Patient had MRI of pelvis done 07/18/20 for SI joint pain, and 3.7 cm right adnexal cyst was noted.   Used Depo Provera in the past and felt very faint.  When she stopped Depo, this resolved.  She does not feel very good when she is taking the birth control.   Some right lower quadrant pain. She does note diarrhea up to 10 times a day.  This is chronic and she has seen providers for evaluation.   She feels tired all the time.  Does not feel well rested after 7 - 8 hours of sleep.  She has seen Dr. Nelson Chimes.  Had a colonoscopy with [place].   Brown rash on her chest.  No itching.   GYNECOLOGIC HISTORY: No LMP recorded. Patient has had an implant. Contraception:  Nexplanon 05/25/19 left arm Menopausal hormone therapy:  n/a Last mammogram:  n/a Last pap smear:  04-26-19 Neg:Neg HR HPV, 2019 normal per patient        OB History     Gravida  1   Para  1   Term      Preterm      AB      Living  1      SAB      IAB      Ectopic      Multiple      Live Births  1              Patient Active Problem List   Diagnosis Date Noted   Depression, major, single episode, moderate ([place]) 05/01/2020   Situational anxiety 04/27/2019   Abdominal cramping 03/12/2019   Diarrhea 03/12/2019   Other fatigue 03/12/2019   Chronic diarrhea 02/03/2019   Generalized abdominal pain 02/03/2019   Rectal bleeding 02/03/2019   Hemorrhoids 02/03/2019   Exocrine pancreatic insufficiency 01/27/2019   Change in bowel habits 12/03/2018   Nausea without vomiting [id number]   Periumbilical abdominal pain 11/10/2018   Non-intractable vomiting 11/10/2018    Gastroesophageal reflux disease 05/17/2018   Environmental allergies 05/17/2018   Nexplanon insertion 04/01/2017    Past Medical History:  Diagnosis Date   Adnexal cyst 07/18/2020   right - 3.7 cm - noted on MRI of pelvis   Anxiety 12/10/2018   on lexapro   Depression    Dysmenorrhea    Exocrine pancreatic insufficiency 2020   GERD (gastroesophageal reflux disease)    Obesity     Past Surgical History:  Procedure Laterality Date   HAND SURGERY Left    age 3, due to injury    OTHER SURGICAL HISTORY     Finger reattachment    Current Outpatient Medications  Medication Sig Dispense Refill   buPROPion (WELLBUTRIN XL) 150 MG 24 hr tablet TAKE 1 TABLET BY MOUTH EVERY DAY 30 tablet 3   clonazePAM (KLONOPIN) 0.5 MG tablet TAKE 1 TABLET BY MOUTH 2 TIMES DAILY AS NEEDED FOR ANXIETY. 30 tablet 0   etonogestrel (NEXPLANON) 68 MG IMPL implant 1 each by Subdermal route once.     IBUPROFEN PO Take by mouth as needed.  Loperamide HCl (IMODIUM PO) Take by mouth daily.     meloxicam (MOBIC) 7.5 MG tablet Take 7.5 mg by mouth daily.     mirtazapine (REMERON) 30 MG tablet TAKE 1 TABLET BY MOUTH EVERYDAY AT BEDTIME 30 tablet 1   ondansetron (ZOFRAN ODT) 8 MG disintegrating tablet Take 1 tablet (8 mg total) by mouth every 8 (eight) hours as needed for nausea, vomiting or refractory nausea / vomiting. 30 tablet 3   pantoprazole (PROTONIX) 40 MG tablet TAKE 1 TABLET BY MOUTH EVERY DAY 30 tablet 1   venlafaxine XR (EFFEXOR-XR) 150 MG 24 hr capsule TAKE 1 CAPSULE BY MOUTH EVERY DAY WITH BREAKFAST 30 capsule 3   No current facility-administered medications for this visit.     ALLERGIES: Patient has no known allergies.  Family History  Problem Relation Age of Onset   Healthy Mother    Hypertension Father    Healthy Father    Healthy Sister    ADD / ADHD Daughter    Healthy Daughter    Diabetes Maternal Grandfather    Rheum arthritis Paternal Grandmother    Colon cancer Paternal  Grandfather 41   Healthy Sister    Esophageal cancer Neg Hx    Inflammatory bowel disease Neg Hx    Liver disease Neg Hx    Pancreatic cancer Neg Hx    Stomach cancer Neg Hx     Social History   Socioeconomic History   Marital status: Married    Spouse name: Tory   Number of children: 1   Years of education: Not on file   Highest education level: Not on file  Occupational History   Not on file  Tobacco Use   Smoking status: Never   Smokeless tobacco: Never  Vaping Use   Vaping Use: Every day   Substances: Nicotine  Substance and Sexual Activity   Alcohol use: Never   Drug use: Yes    Frequency: 1.0 times per week    Types: Marijuana    Comment: occ   Sexual activity: Yes    Partners: Male    Birth control/protection: Condom, Implant    Comment: Nexplanon10-28-20  Other Topics Concern   Not on file  Social History Narrative   Not on file   Social Determinants of Health   Financial Resource Strain: Not on file  Food Insecurity: Not on file  Transportation Needs: Not on file  Physical Activity: Not on file  Stress: Not on file  Social Connections: Not on file  Intimate Partner Violence: Not on file    Review of Systems  All other systems reviewed and are negative.  PHYSICAL EXAMINATION:    BP 112/64   Pulse 97   Ht '5\' 8"'$  (1.727 m)   BMI 34.97 kg/m     General appearance: alert, cooperative and appears stated age  Pelvic US Uterus 7.52 x 469 x 3.76 cm.  EMS 2.07 mm.  Left ovary with 16 x 8 mm avascular cystic area.  No solid features or debris.  Right ovary without cyst.  No adnexal masses.  No free fluid.   ASSESSMENT  Irregular bleeding.  Nexplanon surveillance.  Hx right ovarian cyst on MRI, now resolved.   PLAN  Prior MRI report reviewed. [place] images and report reviewed.  Bleeding profiles with Nexplanon reviewed.  Estrace 0.5 mg daily for one month.  I did review potential risks and benefits. Follow up for annual exam and prn.    An  After Visit Summary was  printed and given to the patient.  26 min  total time was spent for this patient encounter, including preparation, face-to-face counseling with the patient, coordination of care, and documentation of the encounter.

## 2021-04-08 ENCOUNTER — Telehealth: Payer: PRIVATE HEALTH INSURANCE | Admitting: Family

## 2021-04-08 ENCOUNTER — Other Ambulatory Visit: Payer: Self-pay

## 2021-04-09 NOTE — Progress Notes (Signed)
Patient cancelled appointment.

## 2021-04-24 ENCOUNTER — Telehealth (INDEPENDENT_AMBULATORY_CARE_PROVIDER_SITE_OTHER): Payer: PRIVATE HEALTH INSURANCE | Admitting: Family Medicine

## 2021-04-24 VITALS — Temp 101.0°F

## 2021-04-24 DIAGNOSIS — Z20822 Contact with and (suspected) exposure to covid-19: Secondary | ICD-10-CM

## 2021-04-24 MED ORDER — PROMETHAZINE-DM 6.25-15 MG/5ML PO SYRP
5.0000 mL | ORAL_SOLUTION | Freq: Four times a day (QID) | ORAL | 0 refills | Status: DC | PRN
Start: 2021-04-24 — End: 2021-06-10

## 2021-04-24 MED ORDER — BENZONATATE 100 MG PO CAPS
100.0000 mg | ORAL_CAPSULE | Freq: Three times a day (TID) | ORAL | 0 refills | Status: DC | PRN
Start: 2021-04-24 — End: 2021-06-10

## 2021-04-24 NOTE — Patient Instructions (Signed)
Isolate until the covid results are back  You can take up to 3000 mg of tylenol per day and up to 2400 mg of Ibuprofen per day  One regimen 800 mg ibuprofen every 8 hours and Tylenol 1000 mg every 8 hours alternating between   Based on your symptoms, it looks like you have a virus.   Antibiotics are not need for a viral infection but the following will help:   Drink plenty of fluids Get lots of rest  Sinus Congestion 1) Neti Pot (Saline rinse) -- 2 times day -- if tolerated 2) Flonase (Store Brand ok) - once daily 3) Over the counter congestion medications  Cough 1) Cough drops can be helpful 2) Nyquil (or nighttime cough medication) 3) Honey is proven to be one of the best cough medications  4) Cough medicine with Dextromethorphan can also be helpful  Sore Throat 1) Honey as above, cough drops 2) Ibuprofen or Aleve can be helpful 3) Salt water Gargles  If you develop fevers (Temperature >100.4), chills, worsening symptoms or symptoms lasting longer than 10 days return to clinic.

## 2021-04-24 NOTE — Progress Notes (Signed)
I connected with Wanda Acosta on 04/24/21 at 11:40 AM EDT by video and verified that I am speaking with the correct person using two identifiers.   I discussed the limitations, risks, security and privacy concerns of performing an evaluation and management service by video and the availability of in person appointments. I also discussed with the patient that there may be a patient responsible charge related to this service. The patient expressed understanding and agreed to proceed.  Patient location: Home Provider Location: [place] Participants: Lesleigh Noe and Arta Bruce   Subjective:     Wanda Acosta is a 30 y.o. female presenting for Generalized Body Aches, Nasal Congestion, and Sore Throat     Sore Throat  This is a new problem. The current episode started yesterday. The problem has been rapidly worsening. The maximum temperature recorded prior to her arrival was 101 - 101.9 F. Associated symptoms include congestion, coughing, ear pain, headaches and swollen glands. Pertinent negatives include no diarrhea, shortness of breath, trouble swallowing or vomiting. Treatments tried: dayquil, nyquil around the clock. The treatment provided no relief.   Husband - got sick Monday and had negative covid test Was tested for flu and covid with send off tests - notified that it would be within 24 hours   Daytime - tylenol 325 mg and nyquil 325 mg  Review of Systems  Constitutional:  Positive for fever. Negative for chills.  HENT:  Positive for congestion and ear pain. Negative for trouble swallowing.   Respiratory:  Positive for cough. Negative for shortness of breath.   Gastrointestinal:  Positive for nausea. Negative for diarrhea and vomiting.  Musculoskeletal:  Positive for arthralgias and myalgias.  Neurological:  Positive for headaches.    Social History   Tobacco Use  Smoking Status Never  Smokeless Tobacco Never        Objective:   BP Readings from Last 3  Encounters:  03/26/21 112/64  07/17/20 136/87  06/27/20 (!) 140/98   Wt Readings from Last 3 Encounters:  08/06/20 230 lb (104.3 kg)  07/17/20 231 lb (104.8 kg)  06/27/20 231 lb 12.8 oz (105.1 kg)    Temp (!) 101 F (38.3 C)   Physical Exam Constitutional:      Appearance: Normal appearance. She is not ill-appearing.  HENT:     Head: Normocephalic and atraumatic.     Right Ear: External ear normal.     Left Ear: External ear normal.  Eyes:     Conjunctiva/sclera: Conjunctivae normal.  Pulmonary:     Effort: Pulmonary effort is normal. No respiratory distress.  Neurological:     Mental Status: She is alert. Mental status is at baseline.  Psychiatric:        Mood and Affect: Mood normal.        Behavior: Behavior normal.        Thought Content: Thought content normal.        Judgment: Judgment normal.           Assessment & Plan:   Problem List Items Addressed This Visit   None Visit Diagnoses     Suspected COVID-19 virus infection    -  Primary      Patient awaiting test results  Discussed high risk for covid-19 infection due to obesity and if positive she would want treatment.   Lab Results  Component Value Date   ALT 17 06/03/2019   AST 17 06/03/2019   ALKPHOS 86 06/03/2019  BILITOT 0.7 06/03/2019   Lab Results  Component Value Date   CREATININE 0.89 02/10/2020   Candidate for paxlovid if positive will treat.   Discussed flu test pending - by the time results are back will likely be within the window of treatment but due to possible side effects and lack of significant impact will focus on symptom management and not due tamiflu  Isolate until results are back   Return if symptoms worsen or fail to improve.  Lesleigh Noe, MD

## 2021-05-02 ENCOUNTER — Telehealth: Payer: Self-pay

## 2021-05-02 NOTE — Telephone Encounter (Signed)
Pt called stating cough was some better on Sunday, but today she is much worse.  She feels pressure in her chest, lots of coughing and a ton of congestion.  Pt tested positive for Flu A last week.  Please advise.

## 2021-05-02 NOTE — Telephone Encounter (Signed)
Is pt having sinus pressure and pain?   Any new fevers?   If mostly sinus could consider antibiotics, however, if no sinus pressure she may benefit from another visit in-person to evaluate for pneumonia.

## 2021-05-02 NOTE — Telephone Encounter (Signed)
Patient had a video visit on 04/24/21, tested on 04/23/21.  Results just came in today and she is positive for flu A, covid negative.  She was rxd promethazine dm and bezonatate.

## 2021-05-03 MED ORDER — ALBUTEROL SULFATE HFA 108 (90 BASE) MCG/ACT IN AERS: 1.0000 | INHALATION_SPRAY | Freq: Four times a day (QID) | RESPIRATORY_TRACT | 0 refills | Status: DC | PRN

## 2021-05-03 NOTE — Telephone Encounter (Signed)
Left message on machine to call back  

## 2021-05-03 NOTE — Addendum Note (Signed)
Addended by: Kem Boroughs D on: 05/03/2021 08:32 AM   Modules accepted: Orders

## 2021-05-03 NOTE — Telephone Encounter (Signed)
Patient stated that she is just having the chest congestion and pressure.  She stated that the cough syrup does not work good and she has some left.  Advised that she needs to evaluated.  She declined visit another practice as we do not any openings at this time.  She stated she was at work in [employer] so she will go by an urgent in that area to be evaluated.  She would the albuterol sent in to see if it will help.  Inhaler sent in.

## 2021-05-07 ENCOUNTER — Ambulatory Visit: Payer: Self-pay | Admitting: Obstetrics and Gynecology

## 2021-05-07 DIAGNOSIS — Z0289 Encounter for other administrative examinations: Secondary | ICD-10-CM

## 2021-05-07 NOTE — Progress Notes (Deleted)
30 y.o. G1P1 Married Caucasian female here for annual exam.    PCP:     No LMP recorded. Patient has had an implant.           Sexually active: {yes no:314532}  The current method of family planning is Nexplanon Lt.arm 05-25-19 .    Exercising: {yes no:314532}  {types:19826} Smoker:  no  Health Maintenance: Pap:   04-26-19 Neg:Neg HR HPV, 2019 normal per patient History of abnormal Pap:  no MMG:  n/a Colonoscopy:  11/2018 benign polyp;next 10 years BMD:   n/a  Result  n/a TDaP:  ***up to date per patient Gardasil:    Patient had 1 injection--HAD REACTION HIV: 06-27-20 NR Hep C: 06-27-20 Neg Screening Labs:  Hb today: ***, Urine today: ***   reports that she has never smoked. She has never used smokeless tobacco. She reports current drug use. Frequency: 1.00 time per week. Drug: Marijuana. She reports that she does not drink alcohol.  Past Medical History:  Diagnosis Date   Adnexal cyst 07/18/2020   right - 3.7 cm - noted on MRI of pelvis   Anxiety 12/10/2018   on lexapro   Depression    Dysmenorrhea    Exocrine pancreatic insufficiency 2020   GERD (gastroesophageal reflux disease)    Obesity     Past Surgical History:  Procedure Laterality Date   HAND SURGERY Left    age 49, due to injury    OTHER SURGICAL HISTORY     Finger reattachment    Current Outpatient Medications  Medication Sig Dispense Refill   albuterol (VENTOLIN HFA) 108 (90 Base) MCG/ACT inhaler Inhale 1-2 puffs into the lungs every 6 (six) hours as needed. For cough, wheeze, and SOB 18 g 0   benzonatate (TESSALON PERLES) 100 MG capsule Take 1 capsule (100 mg total) by mouth 3 (three) times daily as needed. 20 capsule 0   buPROPion (WELLBUTRIN XL) 150 MG 24 hr tablet TAKE 1 TABLET BY MOUTH EVERY DAY 30 tablet 3   clonazePAM (KLONOPIN) 0.5 MG tablet TAKE 1 TABLET BY MOUTH 2 TIMES DAILY AS NEEDED FOR ANXIETY. 30 tablet 0   estradiol (ESTRACE) 0.5 MG tablet Take 1 tablet (0.5 mg total) by mouth daily. 30  tablet 0   etonogestrel (NEXPLANON) 68 MG IMPL implant 1 each by Subdermal route once.     IBUPROFEN PO Take by mouth as needed.     Loperamide HCl (IMODIUM PO) Take by mouth daily.     meloxicam (MOBIC) 7.5 MG tablet Take 7.5 mg by mouth daily.     mirtazapine (REMERON) 30 MG tablet TAKE 1 TABLET BY MOUTH EVERYDAY AT BEDTIME 30 tablet 1   ondansetron (ZOFRAN ODT) 8 MG disintegrating tablet Take 1 tablet (8 mg total) by mouth every 8 (eight) hours as needed for nausea, vomiting or refractory nausea / vomiting. 30 tablet 3   pantoprazole (PROTONIX) 40 MG tablet TAKE 1 TABLET BY MOUTH EVERY DAY 30 tablet 1   promethazine-dextromethorphan (PROMETHAZINE-DM) 6.25-15 MG/5ML syrup Take 5 mLs by mouth 4 (four) times daily as needed for cough. 118 mL 0   venlafaxine XR (EFFEXOR-XR) 150 MG 24 hr capsule TAKE 1 CAPSULE BY MOUTH EVERY DAY WITH BREAKFAST 30 capsule 3   No current facility-administered medications for this visit.    Family History  Problem Relation Age of Onset   Healthy Mother    Hypertension Father    Healthy Father    Healthy Sister    ADD / ADHD  Daughter    Healthy Daughter    Diabetes Maternal Grandfather    Rheum arthritis Paternal Grandmother    Colon cancer Paternal Grandfather 74   Healthy Sister    Esophageal cancer Neg Hx    Inflammatory bowel disease Neg Hx    Liver disease Neg Hx    Pancreatic cancer Neg Hx    Stomach cancer Neg Hx     Review of Systems  Exam:   There were no vitals taken for this visit.    General appearance: alert, cooperative and appears stated age Head: normocephalic, without obvious abnormality, atraumatic Neck: no adenopathy, supple, symmetrical, trachea midline and thyroid normal to inspection and palpation Lungs: clear to auscultation bilaterally Breasts: normal appearance, no masses or tenderness, No nipple retraction or dimpling, No nipple discharge or bleeding, No axillary adenopathy Heart: regular rate and rhythm Abdomen: soft,  non-tender; no masses, no organomegaly Extremities: extremities normal, atraumatic, no cyanosis or edema Skin: skin color, texture, turgor normal. No rashes or lesions Lymph nodes: cervical, supraclavicular, and axillary nodes normal. Neurologic: grossly normal  Pelvic: External genitalia:  no lesions              No abnormal inguinal nodes palpated.              Urethra:  normal appearing urethra with no masses, tenderness or lesions              Bartholins and Skenes: normal                 Vagina: normal appearing vagina with normal color and discharge, no lesions              Cervix: no lesions              Pap taken: {yes no:314532} Bimanual Exam:  Uterus:  normal size, contour, position, consistency, mobility, non-tender              Adnexa: no mass, fullness, tenderness              Rectal exam: {yes no:314532}.  Confirms.              Anus:  normal sphincter tone, no lesions  Chaperone was present for exam:  ***  Assessment:   Well woman visit with gynecologic exam.   Plan: Mammogram screening discussed. Self breast awareness reviewed. Pap and HR HPV as above. Guidelines for Calcium, Vitamin D, regular exercise program including cardiovascular and weight bearing exercise.   Follow up annually and prn.   Additional counseling given.  {yes [id number]. _______ minutes face to face time of which over 50% was spent in counseling.    After visit summary provided.

## 2021-05-09 ENCOUNTER — Other Ambulatory Visit: Payer: Self-pay | Admitting: Family Medicine

## 2021-05-09 DIAGNOSIS — F411 Generalized anxiety disorder: Secondary | ICD-10-CM

## 2021-05-20 ENCOUNTER — Other Ambulatory Visit: Payer: Self-pay | Admitting: Family Medicine

## 2021-05-20 DIAGNOSIS — F418 Other specified anxiety disorders: Secondary | ICD-10-CM

## 2021-05-27 ENCOUNTER — Other Ambulatory Visit: Payer: Self-pay | Admitting: Gastroenterology

## 2021-05-27 ENCOUNTER — Other Ambulatory Visit: Payer: Self-pay | Admitting: Family Medicine

## 2021-05-27 DIAGNOSIS — F418 Other specified anxiety disorders: Secondary | ICD-10-CM

## 2021-05-27 DIAGNOSIS — K8681 Exocrine pancreatic insufficiency: Secondary | ICD-10-CM

## 2021-05-27 DIAGNOSIS — R1033 Periumbilical pain: Secondary | ICD-10-CM

## 2021-06-10 ENCOUNTER — Encounter: Payer: Self-pay | Admitting: Family Medicine

## 2021-06-10 ENCOUNTER — Ambulatory Visit: Payer: No Typology Code available for payment source | Admitting: Family Medicine

## 2021-06-10 ENCOUNTER — Other Ambulatory Visit: Payer: Self-pay

## 2021-06-10 VITALS — BP 120/80 | HR 96 | Temp 97.6°F | Ht 68.0 in | Wt 230.4 lb

## 2021-06-10 DIAGNOSIS — F321 Major depressive disorder, single episode, moderate: Secondary | ICD-10-CM

## 2021-06-10 DIAGNOSIS — F411 Generalized anxiety disorder: Secondary | ICD-10-CM

## 2021-06-10 DIAGNOSIS — R1084 Generalized abdominal pain: Secondary | ICD-10-CM | POA: Diagnosis not present

## 2021-06-10 MED ORDER — BUPROPION HCL ER (XL) 150 MG PO TB24: 150.0000 mg | ORAL_TABLET | Freq: Every day | ORAL | 2 refills | Status: DC

## 2021-06-10 MED ORDER — CHOLESTYRAMINE 4 G PO PACK: 4.0000 g | PACK | Freq: Three times a day (TID) | ORAL | 10 refills | Status: AC

## 2021-06-10 MED ORDER — CLONAZEPAM 0.5 MG PO TABS: 0.5000 mg | ORAL_TABLET | Freq: Two times a day (BID) | ORAL | 2 refills | Status: AC | PRN

## 2021-06-10 MED ORDER — VENLAFAXINE HCL ER 75 MG PO CP24: ORAL_CAPSULE | ORAL | 0 refills | Status: DC

## 2021-06-10 MED ORDER — NORTRIPTYLINE HCL 50 MG PO CAPS: 50.0000 mg | ORAL_CAPSULE | Freq: Every day | ORAL | 2 refills | Status: DC

## 2021-06-10 MED ORDER — ONDANSETRON 8 MG PO TBDP: 8.0000 mg | ORAL_TABLET | Freq: Three times a day (TID) | ORAL | 3 refills | Status: DC | PRN

## 2021-06-10 NOTE — Patient Instructions (Addendum)
Reach out to Dr. Loletha Grayer again.   If you do not hear anything about your referral in the next 1-2 weeks, call our office and ask for an update.  Take Metamucil or Benefiber daily.  Stay hydrated.   [facility] [address] [place], [place] [address] [phone]  [facility] [address] [place], [place] [address] [phone]  [facility] [address] [place], [place] [address] [phone]  [facility] [address], [place], [place], [phone] [address], [place], [place], [place]  [facility] [place], [place]  [place] [facility] [place], [place], [place]  [facility] [place], [place]  [facility] [place] [address] [place], [place].   Please go online to complete a form to submit first.  [facility]  [address] [place], [place].     [place] -  local practices located at: [address], [place], [place]. [phone]. [place], [place], [place]. [address] [place], [place], [place].  [phone].  Contact one of these offices sooner than later as it can take 2-3 months to get a new patient appointment.

## 2021-06-10 NOTE — Progress Notes (Signed)
Chief Complaint  Patient presents with   Anxiety   Abdominal Pain    Subjective Wanda Acosta presents for f/u anxiety/depression.  Pt is currently being treated with Wellbutrin XL 150 mg/d, Remeron 30 mg qhs prn, Effexor XR 150 mg daily, Klonopin 0.5 mg bid prn.  Reports doing poorly since treatment, particularly over the past 3 mo. No thoughts of harming self or others. No self-medication with alcohol, prescription drugs or illicit drugs. Pt is not following with a counselor/psychologist. Interested in seeing a psychiatrist.   Chronic abd pain Her 3-year chronic abdominal pain continues.  She has failed Bentyl, IBgard, Questran, fiber supplementation, Effexor, PPIs, Imodium, rifaximin, difenoxin, Creon.  She has been to several GI doctors including [employer] who told her she does not have exocrine pancreatic insufficiency.  She had an unremarkable EGD and colonoscopy in 2020.  This is affecting her quality of life and above diagnoses.  She saw a rheumatology in addition to gynecology.  Both work-ups were unremarkable.  She has not seen a gastroenterologist in over a year.  Past Medical History:  Diagnosis Date   Adnexal cyst 07/18/2020   right - 3.7 cm - noted on MRI of pelvis   Anxiety 12/10/2018   on lexapro   Depression    Dysmenorrhea    Exocrine pancreatic insufficiency 2020   GERD (gastroesophageal reflux disease)    Obesity     Exam BP 120/80   Pulse 96   Temp 97.6 F (36.4 C) (Oral)   Ht 5\' 8"  (1.727 m)   Wt 230 lb 6 oz (104.5 kg)   SpO2 99%   BMI 35.03 kg/m  General:  well developed, well nourished, in no apparent distress Heart: RRR Lungs:  CTAB. No respiratory distress Abdomen: Bowel sounds present, soft, mildly uncomfortable to palpation diffusely, no masses or organomegaly Psych: well oriented with normal range of affect and age-appropriate judgement/insight, alert and oriented x4.  Assessment and Plan  Depression, major, single episode, moderate  ([place]) - Plan: Ambulatory referral to Psychiatry, buPROPion (WELLBUTRIN XL) 150 MG 24 hr tablet  GAD (generalized anxiety disorder) - Plan: Ambulatory referral to Psychiatry, clonazePAM (KLONOPIN) 0.5 MG tablet  Generalized abdominal pain - Plan: cholestyramine (QUESTRAN) 4 g packet, nortriptyline (PAMELOR) 50 MG capsule, ondansetron (ZOFRAN ODT) 8 MG disintegrating tablet  1/2. Chronic, worsening.  I will stop her mirtazapine and we will start to wean her off of Effexor.  We will change her to nortriptyline which will hopefully both help her sleep and also her depression/anxiety.  I think this could also help with her chronic abdominal pain if we get lucky.  Psychiatric resources provided in her paperwork and referral placed as well. 3.  Chronic, uncontrolled.  We will see if this is IBS with treatment and referral to psychiatry.  I would like her to reach out to her gastroenterologist again to see his opinion.  He may wish to wait on the specialty appointment.  In the meanwhile, to help control symptoms outside of the medication, she will try to add back Questran in addition to a daily fiber supplement. F/u in 1 month to recheck. The patient voiced understanding and agreement to the plan.  I spent 40 minutes with the patient discussing the above plans in addition to reviewing her chart on the same day of the visit.  [place], DO 06/10/21 7:45 AM

## 2021-06-18 ENCOUNTER — Telehealth: Payer: Self-pay | Admitting: Family Medicine

## 2021-06-18 MED ORDER — IMIPRAMINE HCL 25 MG PO TABS: 25.0000 mg | ORAL_TABLET | Freq: Every day | ORAL | 3 refills | Status: DC

## 2021-06-18 NOTE — Telephone Encounter (Signed)
Alt sent. Ty.

## 2021-06-18 NOTE — Telephone Encounter (Signed)
Called the patient no answer and no VM. 

## 2021-06-18 NOTE — Telephone Encounter (Signed)
Pt stated the medication (nortriptyline (PAMELOR) 50 MG capsule [[id number]]) that was prescribed is causing night terrors and would like to know if there is an alternate medication. Pt has informed me that she has stopped medication as of Tuesday 11/15. Please advise.   [id number] - [place], [place] - [address]  [address] [place], [place] [address]  Phone:  [phone]  Fax:  [phone]

## 2021-06-18 NOTE — Telephone Encounter (Signed)
Called the patient left a detailed message prescription alternative sent in to pharmacy

## 2021-06-24 ENCOUNTER — Other Ambulatory Visit: Payer: Self-pay | Admitting: Gastroenterology

## 2021-06-24 ENCOUNTER — Other Ambulatory Visit: Payer: Self-pay | Admitting: Family Medicine

## 2021-06-24 DIAGNOSIS — F418 Other specified anxiety disorders: Secondary | ICD-10-CM

## 2021-06-24 DIAGNOSIS — R1033 Periumbilical pain: Secondary | ICD-10-CM

## 2021-06-27 ENCOUNTER — Other Ambulatory Visit: Payer: Self-pay | Admitting: Gastroenterology

## 2021-06-27 ENCOUNTER — Telehealth: Payer: Self-pay | Admitting: Family Medicine

## 2021-06-27 DIAGNOSIS — R1033 Periumbilical pain: Secondary | ICD-10-CM

## 2021-06-27 NOTE — Telephone Encounter (Signed)
Pt started new medication wellbutrin and klonopin and is experiencing nausea, itchy skin, and light stomach pain. Pt is still consuming medication and would like advise. Please advise.

## 2021-06-27 NOTE — Telephone Encounter (Signed)
Stop the Wellbutrin, sched appt next week. Less likely to be Klonopin if that has been prn, but could be if she's been using daily. Ty.

## 2021-06-27 NOTE — Telephone Encounter (Signed)
Pt called notified , and appt made for 12/5

## 2021-07-01 ENCOUNTER — Encounter: Payer: Self-pay | Admitting: Family Medicine

## 2021-07-01 ENCOUNTER — Ambulatory Visit: Payer: No Typology Code available for payment source | Admitting: Family Medicine

## 2021-07-01 VITALS — BP 120/80 | HR 105 | Temp 98.4°F | Ht 68.0 in | Wt 231.2 lb

## 2021-07-01 DIAGNOSIS — R1084 Generalized abdominal pain: Secondary | ICD-10-CM

## 2021-07-01 DIAGNOSIS — T50905A Adverse effect of unspecified drugs, medicaments and biological substances, initial encounter: Secondary | ICD-10-CM

## 2021-07-01 MED ORDER — ONDANSETRON 8 MG PO TBDP: 8.0000 mg | ORAL_TABLET | Freq: Three times a day (TID) | ORAL | 3 refills | Status: AC | PRN

## 2021-07-01 MED ORDER — IMIPRAMINE HCL 25 MG PO TABS: 25.0000 mg | ORAL_TABLET | Freq: Every day | ORAL | 3 refills | Status: DC

## 2021-07-01 MED ORDER — PREDNISONE 20 MG PO TABS: 40.0000 mg | ORAL_TABLET | Freq: Every day | ORAL | 0 refills | Status: AC

## 2021-07-01 MED ORDER — METHYLPREDNISOLONE ACETATE 80 MG/ML IJ SUSP
80.0000 mg | Freq: Once | INTRAMUSCULAR | Status: AC
  Administered 2021-07-01: 80 mg via INTRAMUSCULAR

## 2021-07-01 MED ORDER — LEVOCETIRIZINE DIHYDROCHLORIDE 5 MG PO TABS
5.0000 mg | ORAL_TABLET | Freq: Every evening | ORAL | 2 refills | Status: AC
Start: 2021-07-01 — End: ?

## 2021-07-01 NOTE — Patient Instructions (Signed)
Try not to scratch as this can make things worse. Avoid scented products while dealing with this. You may resume when the itchiness resolves. Cold/cool compresses can help.   Start the prednisone tomorrow.  Claritin (loratadine), Allegra (fexofenadine), Zyrtec (cetirizine) which is also equivalent to Xyzal (levocetirizine); these are listed in order from weakest to strongest. Generic, and therefore cheaper, options are in the parentheses.   There are available OTC, and the generic versions, which may be cheaper, are in parentheses. Show this to a pharmacist if you have trouble finding any of these items.  Let us know if you need anything.

## 2021-07-01 NOTE — Progress Notes (Signed)
Chief Complaint  Patient presents with   Follow-up    Medication itching    Wanda Acosta is a 30 y.o. female here for a skin complaint.  Duration: 4 days Location: chest, torso, legs Pruritic? Yes Painful? No Drainage? No New soaps/lotions/topicals/detergents? No Sick contacts? No Other associated symptoms: recently started Questran for bowels and Wellbutrin for depression; stopped the latter last Th Therapies tried thus far: none  Past Medical History:  Diagnosis Date   Adnexal cyst 07/18/2020   right - 3.7 cm - noted on MRI of pelvis   Anxiety 12/10/2018   on lexapro   Depression    Dysmenorrhea    Exocrine pancreatic insufficiency 2020   GERD (gastroesophageal reflux disease)    Obesity     BP 120/80   Pulse (!) 105   Temp 98.4 F (36.9 C) (Oral)   Ht 5\' 8"  (1.727 m)   Wt 231 lb 4 oz (104.9 kg)   SpO2 99%   BMI 35.16 kg/m  Gen: awake, alert, appearing stated age HEENT: MMM, no edema or asymmetry Lungs: CTAB, no access msc use, no stridor Lungs: No accessory muscle use Skin: erythematous patch over upper central chest that does blanch. No drainage, TTP, fluctuance, excoriation Psych: Age appropriate judgment and insight  Adverse effect of drug, initial encounter - Plan: predniSONE (DELTASONE) 20 MG tablet, levocetirizine (XYZAL) 5 MG tablet, methylPREDNISolone acetate (DEPO-MEDROL) injection 80 mg  Generalized abdominal pain - Plan: ondansetron (ZOFRAN ODT) 8 MG disintegrating tablet  Adverse effect of drug for chronic issue. Will rx PO antihist, 5 d pred burst starting tomorrow and Depo injection today. If no improvement, will consider referral vs biopsy. If s/s's return after pred burst ends, will have to consider Questran as a culprit, which would be unfortunate given the improvement she has had with it. F/u prn. The patient voiced understanding and agreement to the plan.  [place], DO 07/01/21 10:53 AM

## 2021-07-12 ENCOUNTER — Telehealth: Payer: No Typology Code available for payment source | Admitting: Family Medicine

## 2021-08-02 ENCOUNTER — Ambulatory Visit: Payer: No Typology Code available for payment source | Admitting: Family Medicine

## 2021-12-08 ENCOUNTER — Emergency Department (HOSPITAL_BASED_OUTPATIENT_CLINIC_OR_DEPARTMENT_OTHER): Payer: No Typology Code available for payment source

## 2021-12-08 ENCOUNTER — Emergency Department (HOSPITAL_BASED_OUTPATIENT_CLINIC_OR_DEPARTMENT_OTHER)
Admission: EM | Admit: 2021-12-08 | Discharge: 2021-12-08 | Disposition: A | Discharge status: Home / Self Care | Payer: No Typology Code available for payment source | Attending: Emergency Medicine | Admitting: Emergency Medicine

## 2021-12-08 ENCOUNTER — Encounter (HOSPITAL_BASED_OUTPATIENT_CLINIC_OR_DEPARTMENT_OTHER): Payer: Self-pay | Admitting: Emergency Medicine

## 2021-12-08 ENCOUNTER — Other Ambulatory Visit: Payer: Self-pay

## 2021-12-08 DIAGNOSIS — K8020 Calculus of gallbladder without cholecystitis without obstruction: Secondary | ICD-10-CM | POA: Diagnosis not present

## 2021-12-08 DIAGNOSIS — R1010 Upper abdominal pain, unspecified: Secondary | ICD-10-CM | POA: Diagnosis present

## 2021-12-08 LAB — URINALYSIS, ROUTINE W REFLEX MICROSCOPIC
Bilirubin Urine: NEGATIVE
Glucose, UA: NEGATIVE mg/dL
Hgb urine dipstick: NEGATIVE
Ketones, ur: NEGATIVE mg/dL
Leukocytes,Ua: NEGATIVE
Nitrite: NEGATIVE
Protein, ur: NEGATIVE mg/dL
Specific Gravity, Urine: <1.005 — ABNORMAL LOW (ref 1.005–1.030)
pH: 5 (ref 5.0–8.0)

## 2021-12-08 LAB — CBC
HCT: 40.7 % (ref 36.0–46.0)
Hemoglobin: 13.2 g/dL (ref 12.0–15.0)
MCH: 26.3 pg (ref 26.0–34.0)
MCHC: 32.4 g/dL (ref 30.0–36.0)
MCV: 81.1 fL (ref 80.0–100.0)
Platelets: 275 10*3/uL (ref 150–400)
RBC: 5.02 MIL/uL (ref 3.87–5.11)
RDW: 12.5 % (ref 11.5–15.5)
WBC: 11.6 10*3/uL — ABNORMAL HIGH (ref 4.0–10.5)
nRBC: 0 % (ref 0.0–0.2)

## 2021-12-08 LAB — LIPASE, BLOOD: Lipase: 15 U/L (ref 11–51)

## 2021-12-08 LAB — COMPREHENSIVE METABOLIC PANEL
ALT: 17 U/L (ref 0–44)
AST: 11 U/L — ABNORMAL LOW (ref 15–41)
Albumin: 4.4 g/dL (ref 3.5–5.0)
Alkaline Phosphatase: 91 U/L (ref 38–126)
Anion gap: 12 (ref 5–15)
BUN: 13 mg/dL (ref 6–20)
CO2: 24 mmol/L (ref 22–32)
Calcium: 9.8 mg/dL (ref 8.9–10.3)
Chloride: 102 mmol/L (ref 98–111)
Creatinine, Ser: 0.84 mg/dL (ref 0.44–1.00)
GFR, Estimated: >60 mL/min (ref >60)
Glucose, Bld: 91 mg/dL (ref 70–99)
Potassium: 4.4 mmol/L (ref 3.5–5.1)
Sodium: 138 mmol/L (ref 135–145)
Total Bilirubin: 0.4 mg/dL (ref 0.3–1.2)
Total Protein: 7.9 g/dL (ref 6.5–8.1)

## 2021-12-08 LAB — PREGNANCY, URINE: Preg Test, Ur: NEGATIVE

## 2021-12-08 MED ORDER — ONDANSETRON HCL 4 MG PO TABS: 4.0000 mg | ORAL_TABLET | Freq: Four times a day (QID) | ORAL | 0 refills | Status: AC

## 2021-12-08 MED ORDER — ONDANSETRON HCL 4 MG/2ML IJ SOLN
4.0000 mg | Freq: Once | INTRAMUSCULAR | Status: AC
  Administered 2021-12-08: 4 mg via INTRAVENOUS
  Filled 2021-12-08: qty 2

## 2021-12-08 MED ORDER — SODIUM CHLORIDE 0.9 % IV BOLUS
1000.0000 mL | Freq: Once | INTRAVENOUS | Status: AC
  Administered 2021-12-08: 1000 mL via INTRAVENOUS

## 2021-12-08 MED ORDER — PANTOPRAZOLE SODIUM 40 MG IV SOLR
40.0000 mg | Freq: Once | INTRAVENOUS | Status: AC
  Administered 2021-12-08: 40 mg via INTRAVENOUS
  Filled 2021-12-08: qty 10

## 2021-12-08 MED ORDER — FENTANYL CITRATE PF 50 MCG/ML IJ SOSY
50.0000 ug | PREFILLED_SYRINGE | Freq: Once | INTRAMUSCULAR | Status: DC
  Filled 2021-12-08: qty 1

## 2021-12-08 MED ORDER — FENTANYL CITRATE PF 50 MCG/ML IJ SOSY
50.0000 ug | PREFILLED_SYRINGE | Freq: Once | INTRAMUSCULAR | Status: AC
  Administered 2021-12-08: 50 ug via INTRAVENOUS
  Filled 2021-12-08: qty 1

## 2021-12-08 MED ORDER — HYDROCODONE-ACETAMINOPHEN 5-325 MG PO TABS: 1.0000 | ORAL_TABLET | ORAL | 0 refills | Status: AC | PRN

## 2021-12-08 NOTE — ED Notes (Signed)
Unable to obtain accurate ekg until pain was addressed. ?

## 2021-12-08 NOTE — ED Triage Notes (Signed)
Awoke this am about 2 with sharp burning pain to central abdomen. Denies any symptoms related to urination. Also endorses lower back pain.  ?

## 2021-12-08 NOTE — ED Provider Notes (Signed)
?[facility] ?Provider Note ? ? ?CSN: [id number] ?Arrival date & time: 12/08/21  0720 ? ?  ? ?History ? ?Chief Complaint  ?Patient presents with  ? Abdominal Pain  ? ? ?Gregoria Selvy is a 31 y.o. female. ? ?Patient is a 31 year old female who presents with abdominal pain.  She does have a history of IBS.  She has had similar pain in the past but has not been this bad.  She woke up during the night with some burning pain to her mid upper abdomen.  It radiates a bit to her back.  She denies any nausea or vomiting.  No fevers.  No urinary symptoms.  No change in her stools.  She said it went away and she went back to sleep but then came back again.  She denies any past abdominal surgeries. ? ? ?  ? ?Home Medications ?Prior to Admission medications   ?Medication Sig Start Date End Date Taking? Authorizing Provider  ?HYDROcodone-acetaminophen (NORCO/VICODIN) 5-325 MG tablet Take 1-2 tablets by mouth every 4 (four) hours as needed. 12/08/21  Yes Malvin Johns, MD  ?ondansetron (ZOFRAN) 4 MG tablet Take 1 tablet (4 mg total) by mouth every 6 (six) hours. 12/08/21  Yes Malvin Johns, MD  ?cholestyramine (QUESTRAN) 4 g packet Take 1 packet (4 g total) by mouth 3 (three) times daily with meals. 06/10/21   Shelda Pal, DO  ?clonazePAM (KLONOPIN) 0.5 MG tablet Take 1 tablet (0.5 mg total) by mouth 2 (two) times daily as needed for anxiety. 06/10/21   Shelda Pal, DO  ?etonogestrel (NEXPLANON) 68 MG IMPL implant 1 each by Subdermal route once.    [provider]  ?imipramine (TOFRANIL) 25 MG tablet Take 1 tablet (25 mg total) by mouth at bedtime. 07/01/21   Shelda Pal, DO  ?levocetirizine (XYZAL) 5 MG tablet Take 1 tablet (5 mg total) by mouth every evening. 07/01/21   Shelda Pal, DO  ?ondansetron (ZOFRAN ODT) 8 MG disintegrating tablet Take 1 tablet (8 mg total) by mouth every 8 (eight) hours as needed for nausea, vomiting or refractory nausea /  vomiting. 07/01/21   Shelda Pal, DO  ?pantoprazole (PROTONIX) 40 MG tablet TAKE 1 TABLET BY MOUTH EVERY DAY 05/27/21   Mansouraty, Telford Nab., MD  ?   ? ?Allergies    ?Patient has no known allergies.   ? ?Review of Systems   ?Review of Systems  ?Constitutional:  Negative for chills, diaphoresis, fatigue and fever.  ?HENT:  Negative for congestion, rhinorrhea and sneezing.   ?Eyes: Negative.   ?Respiratory:  Negative for cough, chest tightness and shortness of breath.   ?Cardiovascular:  Negative for chest pain and leg swelling.  ?Gastrointestinal:  Positive for abdominal pain. Negative for blood in stool, diarrhea, nausea and vomiting.  ?Genitourinary:  Negative for difficulty urinating, flank pain, frequency and hematuria.  ?Musculoskeletal:  Negative for arthralgias and back pain.  ?Skin:  Negative for rash.  ?Neurological:  Negative for dizziness, speech difficulty, weakness, numbness and headaches.  ? ?Physical Exam ?Updated Vital Signs ?BP 124/88   Pulse 76   Temp 98 ?F (36.7 ?C) (Oral)   Resp 18   SpO2 100%  ?Physical Exam ?Constitutional:   ?   Appearance: She is well-developed.  ?HENT:  ?   Head: Normocephalic and atraumatic.  ?Eyes:  ?   Pupils: Pupils are equal, round, and reactive to light.  ?Cardiovascular:  ?   Rate and Rhythm: Normal rate and regular  rhythm.  ?   Heart sounds: Normal heart sounds.  ?Pulmonary:  ?   Effort: Pulmonary effort is normal. No respiratory distress.  ?   Breath sounds: Normal breath sounds. No wheezing or rales.  ?Chest:  ?   Chest wall: No tenderness.  ?Abdominal:  ?   General: Bowel sounds are normal.  ?   Palpations: Abdomen is soft.  ?   Tenderness: There is abdominal tenderness in the right upper quadrant and epigastric area. There is no guarding or rebound.  ?Musculoskeletal:     ?   General: Normal range of motion.  ?   Cervical back: Normal range of motion and neck supple.  ?Lymphadenopathy:  ?   Cervical: No cervical adenopathy.  ?Skin: ?   General:  Skin is warm and dry.  ?   Findings: No rash.  ?Neurological:  ?   Mental Status: She is alert and oriented to person, place, and time.  ? ? ?ED Results / Procedures / Treatments   ?Labs ?(all labs ordered are listed, but only abnormal results are displayed) ?Labs Reviewed  ?COMPREHENSIVE METABOLIC PANEL - Abnormal; Notable for the following components:  ?    Result Value  ? AST 11 (*)   ? All other components within normal limits  ?CBC - Abnormal; Notable for the following components:  ? WBC 11.6 (*)   ? All other components within normal limits  ?URINALYSIS, ROUTINE W REFLEX MICROSCOPIC - Abnormal; Notable for the following components:  ? Color, Urine COLORLESS (*)   ? Specific Gravity, Urine <1.005 (*)   ? All other components within normal limits  ?LIPASE, BLOOD  ?PREGNANCY, URINE  ? ? ?EKG ?EKG Interpretation ? ?Date/Time:  Sunday Dec 08 2021 08:04:45 EDT ?Ventricular Rate:  88 ?PR Interval:  127 ?QRS Duration: 94 ?QT Interval:  366 ?QTC Calculation: 443 ?R Axis:   66 ?Text Interpretation: Sinus rhythm Low voltage, precordial leads Confirmed by Malvin Johns [phone]) on 12/08/2021 8:10:44 AM ? ?Radiology ?US Abdomen Limited RUQ (LIVER/GB) ? ?Result Date: 12/08/2021 ?CLINICAL DATA:  Epigastric abdominal pain EXAM: ULTRASOUND ABDOMEN LIMITED RIGHT UPPER QUADRANT COMPARISON:  None Available. FINDINGS: Gallbladder: Nonmobile gallstone near the gallbladder neck measuring 1.1 cm. Minimal pericholecystic fluid. No gallbladder wall thickening. No sonographic Murphy sign noted by sonographer. Common bile duct: Diameter: 0.3 cm Liver: No focal lesion identified. Increased parenchymal echogenicity. Portal vein is patent on color Doppler imaging with normal direction of blood flow towards the liver. Other: None. IMPRESSION: 1. Nonmobile gallstone near the gallbladder neck measuring 1.1 cm. Minimal pericholecystic fluid. No gallbladder wall thickening or sonographic Murphy sign. HIDA may be used to assess for patency of the  cystic duct if there is high clinical concern for cholecystitis. 2. Hepatic steatosis. Electronically Signed   By: Delanna Ahmadi M.D.   On: 12/08/2021 09:54   ? ?Procedures ?Procedures  ? ? ?Medications Ordered in ED ?Medications  ?fentaNYL (SUBLIMAZE) injection 50 mcg (has no administration in time range)  ?sodium chloride 0.9 % bolus 1,000 mL (1,000 mLs Intravenous New Bag/Given 12/08/21 0802)  ?fentaNYL (SUBLIMAZE) injection 50 mcg (50 mcg Intravenous Given 12/08/21 0759)  ?ondansetron [facility]) injection 4 mg (4 mg Intravenous Given 12/08/21 0759)  ?pantoprazole (PROTONIX) injection 40 mg (40 mg Intravenous Given 12/08/21 0758)  ? ? ?ED Course/ Medical Decision Making/ A&P ?  ?                        ?Medical Decision Making ?  Problems Addressed: ?Calculus of gallbladder without cholecystitis without obstruction: undiagnosed new problem with uncertain prognosis ? ?Amount and/or Complexity of Data Reviewed ?Labs: ordered. Decision-making details documented in ED Course. ?Radiology: ordered. ? ?Risk ?Prescription drug management. ?Parenteral controlled substances. ?Decision regarding hospitalization. ? ? ?Patient is a 31 year old female who presents with pain in her epigastrium and right upper quadrant.  She was given antiemetics and pain medication which improved her symptoms.  She had an ultrasound of her right upper quadrant which shows a large gallstone in the gallbladder neck.  There is no signs of obstruction and that her LFTs are normal.  There is no evidence of pancreatitis.  There is no definitive signs of cholecystitis.  She is afebrile.  Her white count is minimally elevated.  Her pain is well controlled in the ED.  She is tolerating fluids without vomiting.  I feel that at this point she is appropriate for outpatient treatment rather than admission for emergent surgery.  I explained options with the patient.  She was good with this plan.  I will give her a referral to have close follow-up with general  surgery to discuss gallbladder removal.  She was given a prescription for short course of pain medication.  Return precautions were given. ? ?Final Clinical Impression(s) / ED Diagnoses ?Final diagnoses:  ?Calculus

## 2021-12-08 NOTE — ED Notes (Signed)
Dc instructions reviewed with patient. Patient voiced understanding. Dc with belongings.  °

## 2021-12-09 ENCOUNTER — Ambulatory Visit: Payer: Self-pay | Admitting: General Surgery

## 2021-12-09 NOTE — H&P (View-Only) (Signed)
History of Present Illness: Wanda Acosta is a 32 y.o. female who is seen today as an office consultation at the request of Dr. Nani Ravens for evaluation of Cholelithiasis .   Patient is a 31 year old female comes in secondary to abdominal pain.  Patient was recently ER last night secondary to pain that woke her up at 1:00 in the morning.  Patient states that prior to she noticed that she had high fatty meal.  She states that she had significant epigastric pain that she states is sharp.  She states it radiated to the right upper quadrant area.  Patient states that with pain medication in the ER this did resolve some of her pain.  Patient on ultrasound as well as laboratory studies.  I did review these personally.  This did show a large gallstone that was nonmobile the neck of her gallbladder.  Patient also with minimally elevated white count however LFTs were within normal limit.   Patient had no previous abdominal surgery.       Review of Systems: A complete review of systems was obtained from the patient.  I have reviewed this information and discussed as appropriate with the patient.  See HPI as well for other ROS.   Review of Systems  Constitutional: Negative for fever.  HENT: Negative for congestion.   Eyes: Negative for blurred vision.  Respiratory: Negative for cough, shortness of breath and wheezing.   Cardiovascular: Negative for chest pain and palpitations.  Gastrointestinal: Positive for abdominal pain, diarrhea, nausea and vomiting. Negative for heartburn.  Genitourinary: Negative for dysuria.  Musculoskeletal: Negative for myalgias.  Skin: Negative for rash.  Neurological: Negative for dizziness and headaches.  Psychiatric/Behavioral: Negative for depression and suicidal ideas.  All other systems reviewed and are negative.       Medical History: Past Medical History Past Medical History: Diagnosis Date  Anxiety    GERD (gastroesophageal reflux disease)        There is  no problem list on file for this patient.     Past Surgical History Past Surgical History: Procedure Laterality Date  left hand surgery       repair after trauma      Allergies No Known Allergies    Current Outpatient Medications on File Prior to Visit Medication Sig Dispense Refill  cholestyramine-aspartame (QUESTRAN LIGHT) 4 gram oral powder packet Cholestyramine Light 4 gram powder for susp in a packet      clonazePAM (KLONOPIN) 0.5 MG tablet clonazepam 0.5 mg tablet  TAKE 1 TABLET (0.5 MG TOTAL) BY MOUTH 2 (TWO) TIMES DAILY AS NEEDED FOR ANXIETY.      etonorgestrel (NEXPLANON) 68 mg implant Inject subcutaneously      fluticasone propionate (FLONASE) 50 mcg/actuation nasal spray by Nasal route as needed      hydrOXYzine (ATARAX) 25 MG tablet Take by mouth      imipramine (TOFRANIL) 25 MG tablet Take 25 mg by mouth at bedtime      mirtazapine (REMERON) 30 MG tablet Take by mouth      ondansetron (ZOFRAN-ODT) 8 MG disintegrating tablet Take by mouth      pancrelipase (CREON) 36,000-114,000-180,000 unit DR capsule Take up to 6 per meal and 3 per snack 600 capsule 11  pantoprazole (PROTONIX) 40 MG DR tablet Take by mouth      venlafaxine (EFFEXOR-XR) 150 MG XR capsule Take by mouth       No current facility-administered medications on file prior to visit.     Family History  Family History Problem Relation Age of Onset  No Known Problems Mother    No Known Problems Father    No Known Problems Sister    Myocardial Infarction (Heart attack) Paternal Grandmother 9  Colon cancer Paternal Grandfather 61  No Known Problems Sister    ADD / ADHD Daughter        Social History   Tobacco Use Smoking Status Every Day  Types: Cigarettes Smokeless Tobacco Never     Social History Social History    Socioeconomic History  Marital status: Married Tobacco Use  Smoking status: Every Day     Types: Cigarettes  Smokeless tobacco: Never Vaping Use  Vaping Use: Every  day Substance and Sexual Activity  Alcohol use: Not Currently     Comment: rare  Drug use: Yes     Frequency: 5.0 times per week     Types: Marijuana      Objective:     Vitals:   12/09/21 1411 BP: 118/74 Pulse: (!) 121 Weight: 98.4 kg (217 lb) Height: 172.7 cm ('5\' 8"'$ )   Body mass index is 32.99 kg/m.   Physical Exam Constitutional:      Appearance: Normal appearance.  HENT:     Head: Normocephalic and atraumatic.     Mouth/Throat:     Mouth: Mucous membranes are moist.     Pharynx: Oropharynx is clear.  Eyes:     General: No scleral icterus.    Pupils: Pupils are equal, round, and reactive to light.  Cardiovascular:     Rate and Rhythm: Normal rate and regular rhythm.     Pulses: Normal pulses.     Heart sounds: No murmur heard.   No friction rub. No gallop.  Pulmonary:     Effort: Pulmonary effort is normal. No respiratory distress.     Breath sounds: Normal breath sounds. No stridor.  Abdominal:     General: Abdomen is flat.  Musculoskeletal:        General: No swelling.  Skin:    General: Skin is warm.  Neurological:     General: No focal deficit present.     Mental Status: She is alert and oriented to person, place, and time. Mental status is at baseline.  Psychiatric:        Mood and Affect: Mood normal.        Thought Content: Thought content normal.        Judgment: Judgment normal.        Assessment and Plan: Diagnoses and all orders for this visit:   Symptomatic cholelithiasis     Wanda Acosta is a 31 y.o. female    1.  We will proceed to the OR for a lap cholecystectomy. 2. All risks and benefits were discussed with the patient to generally include: infection, bleeding, possible need for post op ERCP, damage to the bile ducts, and bile leak. Alternatives were offered and described.  All questions were answered and the patient voiced understanding of the procedure and wishes to proceed at this point with a laparoscopic cholecystectomy            No follow-ups on file.   Ralene Ok, MD, [facility], [place] [facility] Minimally Invasive Surgery

## 2021-12-09 NOTE — H&P (Signed)
?History of Present Illness: ?Wanda Acosta is a 31 y.o. female who is seen today as an office consultation at the request of Dr. Nani Ravens for evaluation of Cholelithiasis ?Marland Kitchen   ?Patient is a 31 year old female comes in secondary to abdominal pain.  Patient was recently ER last night secondary to pain that woke her up at 1:00 in the morning.  Patient states that prior to she noticed that she had high fatty meal.  She states that she had significant epigastric pain that she states is sharp.  She states it radiated to the right upper quadrant area.  Patient states that with pain medication in the ER this did resolve some of her pain.  Patient on ultrasound as well as laboratory studies.  I did review these personally.  This did show a large gallstone that was nonmobile the neck of her gallbladder.  Patient also with minimally elevated white count however LFTs were within normal limit. ?  ?Patient had no previous abdominal surgery. ?  ?  ?  ?Review of Systems: ?A complete review of systems was obtained from the patient.  I have reviewed this information and discussed as appropriate with the patient.  See HPI as well for other ROS. ?  ?Review of Systems  ?Constitutional: Negative for fever.  ?HENT: Negative for congestion.   ?Eyes: Negative for blurred vision.  ?Respiratory: Negative for cough, shortness of breath and wheezing.   ?Cardiovascular: Negative for chest pain and palpitations.  ?Gastrointestinal: Positive for abdominal pain, diarrhea, nausea and vomiting. Negative for heartburn.  ?Genitourinary: Negative for dysuria.  ?Musculoskeletal: Negative for myalgias.  ?Skin: Negative for rash.  ?Neurological: Negative for dizziness and headaches.  ?Psychiatric/Behavioral: Negative for depression and suicidal ideas.  ?All other systems reviewed and are negative. ?  ?  ?  ?Medical History: ?Past Medical History ?Past Medical History: ?Diagnosis Date ? Anxiety   ? GERD (gastroesophageal reflux disease)   ? ?  ?  ?There is  no problem list on file for this patient. ?  ?  ?Past Surgical History ?Past Surgical History: ?Procedure Laterality Date ? left hand surgery     ?  repair after trauma ? ?  ?  ?Allergies ?No Known Allergies ? ?  ?Current Outpatient Medications on File Prior to Visit ?Medication Sig Dispense Refill ? cholestyramine-aspartame (QUESTRAN LIGHT) 4 gram oral powder packet Cholestyramine Light 4 gram powder for susp in a packet     ? clonazePAM (KLONOPIN) 0.5 MG tablet clonazepam 0.5 mg tablet ? TAKE 1 TABLET (0.5 MG TOTAL) BY MOUTH 2 (TWO) TIMES DAILY AS NEEDED FOR ANXIETY.     ? etonorgestrel (NEXPLANON) 68 mg implant Inject subcutaneously     ? fluticasone propionate (FLONASE) 50 mcg/actuation nasal spray by Nasal route as needed     ? hydrOXYzine (ATARAX) 25 MG tablet Take by mouth     ? imipramine (TOFRANIL) 25 MG tablet Take 25 mg by mouth at bedtime     ? mirtazapine (REMERON) 30 MG tablet Take by mouth     ? ondansetron (ZOFRAN-ODT) 8 MG disintegrating tablet Take by mouth     ? pancrelipase (CREON) 36,000-114,000-180,000 unit DR capsule Take up to 6 per meal and 3 per snack 600 capsule 11 ? pantoprazole (PROTONIX) 40 MG DR tablet Take by mouth     ? venlafaxine (EFFEXOR-XR) 150 MG XR capsule Take by mouth     ?  ?No current facility-administered medications on file prior to visit. ?  ?  ?Family History ?  Family History ?Problem Relation Age of Onset ? No Known Problems Mother   ? No Known Problems Father   ? No Known Problems Sister   ? Myocardial Infarction (Heart attack) Paternal Grandmother 88 ? Colon cancer Paternal Grandfather 27 ? No Known Problems Sister   ? ADD / ADHD Daughter   ? ?  ?  ?Social History ?  ?Tobacco Use ?Smoking Status Every Day ? Types: Cigarettes ?Smokeless Tobacco Never ?  ?  ?Social History ?Social History ?  ? ?Socioeconomic History ? Marital status: Married ?Tobacco Use ? Smoking status: Every Day ?    Types: Cigarettes ? Smokeless tobacco: Never ?Vaping Use ? Vaping Use: Every  day ?Substance and Sexual Activity ? Alcohol use: Not Currently ?    Comment: rare ? Drug use: Yes ?    Frequency: 5.0 times per week ?    Types: Marijuana ? ?  ?  ?Objective: ?  ?  ?Vitals: ?  12/09/21 1411 ?BP: 118/74 ?Pulse: (!) 121 ?Weight: 98.4 kg (217 lb) ?Height: 172.7 cm ('5\' 8"'$ ) ?  ?Body mass index is 32.99 kg/m?. ?  ?Physical Exam ?Constitutional:   ?   Appearance: Normal appearance.  ?HENT:  ?   Head: Normocephalic and atraumatic.  ?   Mouth/Throat:  ?   Mouth: Mucous membranes are moist.  ?   Pharynx: Oropharynx is clear.  ?Eyes:  ?   General: No scleral icterus. ?   Pupils: Pupils are equal, round, and reactive to light.  ?Cardiovascular:  ?   Rate and Rhythm: Normal rate and regular rhythm.  ?   Pulses: Normal pulses.  ?   Heart sounds: No murmur heard. ?  No friction rub. No gallop.  ?Pulmonary:  ?   Effort: Pulmonary effort is normal. No respiratory distress.  ?   Breath sounds: Normal breath sounds. No stridor.  ?Abdominal:  ?   General: Abdomen is flat.  ?Musculoskeletal:     ?   General: No swelling.  ?Skin: ?   General: Skin is warm.  ?Neurological:  ?   General: No focal deficit present.  ?   Mental Status: She is alert and oriented to person, place, and time. Mental status is at baseline.  ?Psychiatric:     ?   Mood and Affect: Mood normal.     ?   Thought Content: Thought content normal.     ?   Judgment: Judgment normal.  ?  ?  ?  ?Assessment and Plan: ?Diagnoses and all orders for this visit: ?  ?Symptomatic cholelithiasis ?  ?  ?Wanda Acosta is a 31 y.o. female  ?  ?1.  We will proceed to the OR for a lap cholecystectomy. ?2. All risks and benefits were discussed with the patient to generally include: infection, bleeding, possible need for post op ERCP, damage to the bile ducts, and bile leak. Alternatives were offered and described.  All questions were answered and the patient voiced understanding of the procedure and wishes to proceed at this point with a laparoscopic cholecystectomy ?  ?  ?   ?  ?  ?No follow-ups on file. ?  ?Ralene Ok, MD, FACS ?[place], [place] ?General & Minimally Invasive Surgery ?  ? ?

## 2021-12-11 ENCOUNTER — Encounter (HOSPITAL_COMMUNITY): Payer: Self-pay | Admitting: General Surgery

## 2021-12-11 ENCOUNTER — Other Ambulatory Visit: Payer: Self-pay

## 2021-12-11 NOTE — Pre-Procedure Instructions (Signed)
SDW CALL ? ?Patient was given pre-op instructions over the phone. The opportunity was given for the patient to ask questions. No further questions asked. Patient verbalized understanding of instructions given. ? ? ?PCP - Shelda Pal, DO ?Cardiologist - denies ? ?PPM/ICD - denies ?Chest x-ray - denies ?EKG -  12/08/21 ?Stress Test - denies ?ECHO - denies ?Cardiac Cath - denies ? ?Sleep Study - denies sleep apnea ? ?Aspirin Instructions: pt denies blood thinner or ASA use ? ?ERAS Protcol -ERAS order per surgeon ?  ? ?COVID TEST- N/A ? ? ?Anesthesia review:  ? ?Patient denies shortness of breath, fever, cough and chest pain over the phone call ? ? ? ?Surgical Instructions ? ? ? Your procedure is scheduled on Friday, May 19 ? Report to [facility] Main Entrance "A" at 1030 A.M., then check in with the Admitting office. ? Call this number if you have problems the morning of surgery: ? [phone] ? ? ? Remember: ? Do not eat after midnight the night before your surgery ? ?You may drink clear liquids until 10:00AM the morning of your surgery.   ?Clear liquids allowed are: Water, Non-Citrus Juices (without pulp), Carbonated Beverages, Clear Tea, Black Coffee ONLY (NO MILK, CREAM OR POWDERED CREAMER of any kind), and Gatorade ? ? Take these medicines the morning of surgery with A SIP OF WATER:  ? ?Tylenol if needed ?clonazePAM (KLONOPIN) if needed ?ondansetron (ZOFRAN ODT) if needed ? ?As of today, STOP taking any Aspirin (unless otherwise instructed by your surgeon) Aleve, Naproxen, Ibuprofen, Motrin, Advil, Goody's, BC's, all herbal medications, fish oil, and all vitamins. ? ?[facility] is not responsible for any belongings or valuables.  ? ?Do NOT Smoke (Tobacco/Vaping)  24 hours prior to your procedure ?  ?Contacts, glasses, hearing aids, dentures or partials may not be worn into surgery, please bring cases for these belongings ?  ?Patients discharged the day of surgery will not be allowed to drive home, and  someone needs to stay with them for 24 hours. ? ? ?SURGICAL WAITING ROOM VISITATION ?No visitors are allowed in pre-op area with patient.  ?Patients having surgery or a procedure in a hospital may have two support people in the waiting room. ?Children under the age of 20 must have an adult with them who is not the patient. ?They may stay in the waiting area during the procedure and may switch out with other visitors. If the patient needs to stay at the hospital during part of their recovery, the visitor guidelines for inpatient rooms apply. ? ?Please refer to the [facility] website for the visitor guidelines for Inpatients (after your surgery is over and you are in a regular room).  ? ? ? ?Special instructions:   ? ?Oral Hygiene is also important to reduce your risk of infection.  Remember - BRUSH YOUR TEETH THE MORNING OF SURGERY WITH YOUR REGULAR TOOTHPASTE ? ? ?Day of Surgery: ? ?Take a shower the day of or night before with antibacterial soap. ?Wear Clean/Comfortable clothing the morning of surgery ?Do not apply any deodorants/lotions.   ?Do not wear jewelry or makeup ?Do not wear lotions, powders, perfumes/colognes, or deodorant. ?Do not shave 48 hours prior to surgery.  Men may shave face and neck. ?Do not bring valuables to the hospital. ?Do not wear nail polish, gel polish, artificial nails, or any other type of covering on natural nails (fingers and toes) ?If you have artificial nails or gel coating that need to be removed by a  nail salon, please have this removed prior to surgery. Artificial nails or gel coating may interfere with anesthesia's ability to adequately monitor your vital signs. ?Remember to brush your teeth WITH YOUR REGULAR TOOTHPASTE. ? ? ? ? ? ?

## 2021-12-13 ENCOUNTER — Ambulatory Visit (HOSPITAL_COMMUNITY): Payer: No Typology Code available for payment source | Admitting: Anesthesiology

## 2021-12-13 ENCOUNTER — Encounter (HOSPITAL_COMMUNITY): Payer: Self-pay | Admitting: General Surgery

## 2021-12-13 ENCOUNTER — Other Ambulatory Visit: Payer: Self-pay

## 2021-12-13 ENCOUNTER — Ambulatory Visit (HOSPITAL_COMMUNITY)
Admission: RE | Admit: 2021-12-13 | Discharge: 2021-12-13 | Disposition: A | Discharge status: Home / Self Care | Payer: No Typology Code available for payment source | Attending: General Surgery | Admitting: General Surgery

## 2021-12-13 ENCOUNTER — Ambulatory Visit (HOSPITAL_BASED_OUTPATIENT_CLINIC_OR_DEPARTMENT_OTHER): Payer: No Typology Code available for payment source | Admitting: Anesthesiology

## 2021-12-13 ENCOUNTER — Encounter (HOSPITAL_COMMUNITY)
Admission: RE | Disposition: A | Discharge status: Home / Self Care | Payer: Self-pay | Source: Home / Self Care | Attending: General Surgery

## 2021-12-13 DIAGNOSIS — K219 Gastro-esophageal reflux disease without esophagitis: Secondary | ICD-10-CM | POA: Insufficient documentation

## 2021-12-13 DIAGNOSIS — F32A Depression, unspecified: Secondary | ICD-10-CM | POA: Diagnosis not present

## 2021-12-13 DIAGNOSIS — K8010 Calculus of gallbladder with chronic cholecystitis without obstruction: Secondary | ICD-10-CM | POA: Insufficient documentation

## 2021-12-13 DIAGNOSIS — F17210 Nicotine dependence, cigarettes, uncomplicated: Secondary | ICD-10-CM | POA: Diagnosis not present

## 2021-12-13 DIAGNOSIS — F419 Anxiety disorder, unspecified: Secondary | ICD-10-CM | POA: Diagnosis not present

## 2021-12-13 HISTORY — PX: CHOLECYSTECTOMY: SHX55

## 2021-12-13 HISTORY — DX: Other specified postprocedural states: Z98.890

## 2021-12-13 LAB — POCT PREGNANCY, URINE: Preg Test, Ur: NEGATIVE

## 2021-12-13 SURGERY — LAPAROSCOPIC CHOLECYSTECTOMY
Anesthesia: General

## 2021-12-13 MED ORDER — CHLORHEXIDINE GLUCONATE CLOTH 2 % EX PADS: 6.0000 | MEDICATED_PAD | Freq: Once | CUTANEOUS | Status: DC

## 2021-12-13 MED ORDER — MIDAZOLAM HCL 2 MG/2ML IJ SOLN
INTRAMUSCULAR | Status: AC
  Filled 2021-12-13: qty 2

## 2021-12-13 MED ORDER — ROCURONIUM BROMIDE 10 MG/ML (PF) SYRINGE
PREFILLED_SYRINGE | INTRAVENOUS | Status: DC | PRN
  Administered 2021-12-13: 80 mg via INTRAVENOUS

## 2021-12-13 MED ORDER — MIDAZOLAM HCL 2 MG/2ML IJ SOLN
INTRAMUSCULAR | Status: DC | PRN
  Administered 2021-12-13: 2 mg via INTRAVENOUS

## 2021-12-13 MED ORDER — FENTANYL CITRATE (PF) 250 MCG/5ML IJ SOLN
INTRAMUSCULAR | Status: AC
Start: 2021-12-13 — End: ?
  Filled 2021-12-13: qty 5

## 2021-12-13 MED ORDER — HYDROMORPHONE HCL 1 MG/ML IJ SOLN
0.2500 mg | INTRAMUSCULAR | Status: DC | PRN
  Administered 2021-12-13 (×2): 0.5 mg via INTRAVENOUS

## 2021-12-13 MED ORDER — 0.9 % SODIUM CHLORIDE (POUR BTL) OPTIME
TOPICAL | Status: DC | PRN
Start: 2021-12-13 — End: 2021-12-13
  Administered 2021-12-13: 1000 mL

## 2021-12-13 MED ORDER — DIPHENHYDRAMINE HCL 50 MG/ML IJ SOLN
INTRAMUSCULAR | Status: AC
  Filled 2021-12-13: qty 1

## 2021-12-13 MED ORDER — CHLORHEXIDINE GLUCONATE 0.12 % MT SOLN
OROMUCOSAL | Status: AC
  Administered 2021-12-13: 15 mL via OROMUCOSAL
  Filled 2021-12-13: qty 15

## 2021-12-13 MED ORDER — HYDROMORPHONE HCL 1 MG/ML IJ SOLN
INTRAMUSCULAR | Status: AC
  Administered 2021-12-13: 0.5 mg via INTRAVENOUS
  Filled 2021-12-13: qty 1

## 2021-12-13 MED ORDER — TRAMADOL HCL 50 MG PO TABS: 50.0000 mg | ORAL_TABLET | Freq: Four times a day (QID) | ORAL | 0 refills | Status: AC | PRN

## 2021-12-13 MED ORDER — ONDANSETRON HCL 4 MG/2ML IJ SOLN
INTRAMUSCULAR | Status: DC | PRN
Start: 2021-12-13 — End: 2021-12-13
  Administered 2021-12-13: 4 mg via INTRAVENOUS

## 2021-12-13 MED ORDER — MIDAZOLAM HCL 2 MG/2ML IJ SOLN: 0.5000 mg | Freq: Once | INTRAMUSCULAR | Status: DC | PRN

## 2021-12-13 MED ORDER — DIPHENHYDRAMINE HCL 50 MG/ML IJ SOLN
INTRAMUSCULAR | Status: DC | PRN
  Administered 2021-12-13: 12.5 mg via INTRAVENOUS

## 2021-12-13 MED ORDER — OXYCODONE HCL 5 MG PO TABS: 5.0000 mg | ORAL_TABLET | Freq: Once | ORAL | Status: AC | PRN

## 2021-12-13 MED ORDER — ESMOLOL HCL 100 MG/10ML IV SOLN
INTRAVENOUS | Status: DC | PRN
  Administered 2021-12-13: 20 mg via INTRAVENOUS

## 2021-12-13 MED ORDER — OXYCODONE HCL 5 MG PO TABS
ORAL_TABLET | ORAL | Status: AC
  Administered 2021-12-13: 5 mg via ORAL
  Filled 2021-12-13: qty 1

## 2021-12-13 MED ORDER — MEPERIDINE HCL 25 MG/ML IJ SOLN: 6.2500 mg | INTRAMUSCULAR | Status: DC | PRN

## 2021-12-13 MED ORDER — DEXAMETHASONE SODIUM PHOSPHATE 10 MG/ML IJ SOLN
INTRAMUSCULAR | Status: DC | PRN
  Administered 2021-12-13: 10 mg via INTRAVENOUS

## 2021-12-13 MED ORDER — ORAL CARE MOUTH RINSE: 15.0000 mL | Freq: Once | OROMUCOSAL | Status: AC

## 2021-12-13 MED ORDER — DEXAMETHASONE SODIUM PHOSPHATE 10 MG/ML IJ SOLN
INTRAMUSCULAR | Status: AC
  Filled 2021-12-13: qty 1

## 2021-12-13 MED ORDER — PROMETHAZINE HCL 25 MG/ML IJ SOLN: 6.2500 mg | INTRAMUSCULAR | Status: DC | PRN

## 2021-12-13 MED ORDER — PROPOFOL 10 MG/ML IV BOLUS
INTRAVENOUS | Status: AC
  Filled 2021-12-13: qty 20

## 2021-12-13 MED ORDER — PROMETHAZINE HCL 25 MG/ML IJ SOLN
INTRAMUSCULAR | Status: AC
  Administered 2021-12-13: 6.25 mg via INTRAVENOUS
  Filled 2021-12-13: qty 1

## 2021-12-13 MED ORDER — ACETAMINOPHEN 500 MG PO TABS
1000.0000 mg | ORAL_TABLET | ORAL | Status: AC
  Administered 2021-12-13: 1000 mg via ORAL
  Filled 2021-12-13: qty 2

## 2021-12-13 MED ORDER — OXYCODONE HCL 5 MG/5ML PO SOLN: 5.0000 mg | Freq: Once | ORAL | Status: AC | PRN

## 2021-12-13 MED ORDER — SUGAMMADEX SODIUM 200 MG/2ML IV SOLN
INTRAVENOUS | Status: DC | PRN
Start: 2021-12-13 — End: 2021-12-13
  Administered 2021-12-13: 200 mg via INTRAVENOUS

## 2021-12-13 MED ORDER — BUPIVACAINE HCL 0.25 % IJ SOLN
INTRAMUSCULAR | Status: DC | PRN
  Administered 2021-12-13: 7.5 mL

## 2021-12-13 MED ORDER — LIDOCAINE 2% (20 MG/ML) 5 ML SYRINGE
INTRAMUSCULAR | Status: DC | PRN
  Administered 2021-12-13: 80 mg via INTRAVENOUS

## 2021-12-13 MED ORDER — PROPOFOL 500 MG/50ML IV EMUL
INTRAVENOUS | Status: DC | PRN
  Administered 2021-12-13: 150 mg via INTRAVENOUS
  Administered 2021-12-13: 200 ug/kg/min via INTRAVENOUS

## 2021-12-13 MED ORDER — LACTATED RINGERS IV SOLN: INTRAVENOUS | Status: DC

## 2021-12-13 MED ORDER — SCOPOLAMINE 1 MG/3DAYS TD PT72
1.0000 | MEDICATED_PATCH | TRANSDERMAL | Status: DC
  Administered 2021-12-13: 1.5 mg via TRANSDERMAL
  Filled 2021-12-13: qty 1

## 2021-12-13 MED ORDER — SODIUM CHLORIDE 0.9 % IR SOLN
Status: DC | PRN
  Administered 2021-12-13: 1000 mL

## 2021-12-13 MED ORDER — CEFAZOLIN SODIUM-DEXTROSE 2-4 GM/100ML-% IV SOLN
2.0000 g | INTRAVENOUS | Status: AC
  Administered 2021-12-13: 2 g via INTRAVENOUS
  Filled 2021-12-13: qty 100

## 2021-12-13 MED ORDER — ONDANSETRON HCL 4 MG/2ML IJ SOLN
INTRAMUSCULAR | Status: AC
  Filled 2021-12-13: qty 2

## 2021-12-13 MED ORDER — CELECOXIB 200 MG PO CAPS
400.0000 mg | ORAL_CAPSULE | ORAL | Status: AC
  Administered 2021-12-13: 400 mg via ORAL
  Filled 2021-12-13: qty 2

## 2021-12-13 MED ORDER — ENSURE PRE-SURGERY PO LIQD: 296.0000 mL | Freq: Once | ORAL | Status: DC

## 2021-12-13 MED ORDER — CHLORHEXIDINE GLUCONATE 0.12 % MT SOLN: 15.0000 mL | Freq: Once | OROMUCOSAL | Status: AC

## 2021-12-13 MED ORDER — FENTANYL CITRATE (PF) 250 MCG/5ML IJ SOLN
INTRAMUSCULAR | Status: DC | PRN
  Administered 2021-12-13: 100 ug via INTRAVENOUS
  Administered 2021-12-13 (×3): 50 ug via INTRAVENOUS

## 2021-12-13 MED ORDER — BUPIVACAINE-EPINEPHRINE (PF) 0.25% -1:200000 IJ SOLN
INTRAMUSCULAR | Status: AC
  Filled 2021-12-13: qty 30

## 2021-12-13 SURGICAL SUPPLY — 44 items
ADH SKN CLS APL DERMABOND .7 (GAUZE/BANDAGES/DRESSINGS) ×1
APL PRP STRL LF DISP 70% ISPRP (MISCELLANEOUS) ×1
APPLIER CLIP 5 13 M/L LIGAMAX5 (MISCELLANEOUS) ×2
APR CLP MED LRG 5 ANG JAW (MISCELLANEOUS) ×1
BAG COUNTER SPONGE SURGICOUNT (BAG) ×2 IMPLANT
BAG SPNG CNTER NS LX DISP (BAG) ×1
CANISTER SUCT 3000ML PPV (MISCELLANEOUS) ×2 IMPLANT
CHLORAPREP W/TINT 26 (MISCELLANEOUS) ×2 IMPLANT
CLIP APPLIE 5 13 M/L LIGAMAX5 (MISCELLANEOUS) IMPLANT
CLIP LIGATING HEMO O LOK GREEN (MISCELLANEOUS) ×3 IMPLANT
COVER SURGICAL LIGHT HANDLE (MISCELLANEOUS) ×2 IMPLANT
COVER TRANSDUCER ULTRASND (DRAPES) ×2 IMPLANT
DERMABOND ADVANCED (GAUZE/BANDAGES/DRESSINGS) ×1
DERMABOND ADVANCED .7 DNX12 (GAUZE/BANDAGES/DRESSINGS) ×1 IMPLANT
ELECT REM PT RETURN 9FT ADLT (ELECTROSURGICAL) ×2
ELECTRODE REM PT RTRN 9FT ADLT (ELECTROSURGICAL) ×1 IMPLANT
GLOVE BIO SURGEON STRL SZ7.5 (GLOVE) ×2 IMPLANT
GLOVE SURG SYN 7.5  E (GLOVE) ×1
GLOVE SURG SYN 7.5 E (GLOVE) ×1 IMPLANT
GLOVE SURG SYN 7.5 PF PI (GLOVE) ×1 IMPLANT
GOWN STRL REUS W/ TWL LRG LVL3 (GOWN DISPOSABLE) ×2 IMPLANT
GOWN STRL REUS W/ TWL XL LVL3 (GOWN DISPOSABLE) ×1 IMPLANT
GOWN STRL REUS W/TWL LRG LVL3 (GOWN DISPOSABLE) ×4
GOWN STRL REUS W/TWL XL LVL3 (GOWN DISPOSABLE) ×2
GRASPER SUT TROCAR 14GX15 (MISCELLANEOUS) ×2 IMPLANT
KIT BASIN OR (CUSTOM PROCEDURE TRAY) ×2 IMPLANT
KIT TURNOVER KIT B (KITS) ×2 IMPLANT
NDL INSUFFLATION 14GA 120MM (NEEDLE) ×1 IMPLANT
NEEDLE INSUFFLATION 14GA 120MM (NEEDLE) ×2 IMPLANT
NS IRRIG 1000ML POUR BTL (IV SOLUTION) ×2 IMPLANT
PAD ARMBOARD 7.5X6 YLW CONV (MISCELLANEOUS) ×2 IMPLANT
POUCH LAPAROSCOPIC INSTRUMENT (MISCELLANEOUS) ×2 IMPLANT
SCISSORS LAP 5X35 DISP (ENDOMECHANICALS) ×2 IMPLANT
SET IRRIG TUBING LAPAROSCOPIC (IRRIGATION / IRRIGATOR) ×2 IMPLANT
SET TUBE SMOKE EVAC HIGH FLOW (TUBING) ×2 IMPLANT
SLEEVE ENDOPATH XCEL 5M (ENDOMECHANICALS) ×2 IMPLANT
SPECIMEN JAR SMALL (MISCELLANEOUS) ×2 IMPLANT
SUT MNCRL AB 4-0 PS2 18 (SUTURE) ×2 IMPLANT
TOWEL GREEN STERILE (TOWEL DISPOSABLE) ×2 IMPLANT
TOWEL GREEN STERILE FF (TOWEL DISPOSABLE) ×2 IMPLANT
TRAY LAPAROSCOPIC MC (CUSTOM PROCEDURE TRAY) ×2 IMPLANT
TROCAR XCEL NON-BLD 11X100MML (ENDOMECHANICALS) ×2 IMPLANT
TROCAR XCEL NON-BLD 5MMX100MML (ENDOMECHANICALS) ×2 IMPLANT
WARMER LAPAROSCOPE (MISCELLANEOUS) ×2 IMPLANT

## 2021-12-13 NOTE — Discharge Instructions (Signed)
[facility] ______[facility] LAPAROSCOPIC SURGERY: POST OP INSTRUCTIONS Always review your discharge instruction sheet given to you by the facility where your surgery was performed. IF YOU HAVE DISABILITY OR FAMILY LEAVE FORMS, YOU MUST BRING THEM TO THE OFFICE FOR PROCESSING.   DO NOT GIVE THEM TO YOUR DOCTOR.  A prescription for pain medication may be given to you upon discharge.  Take your pain medication as prescribed, if needed.  If narcotic pain medicine is not needed, then you may take acetaminophen (Tylenol) or ibuprofen (Advil) as needed. Take your usually prescribed medications unless otherwise directed. If you need a refill on your pain medication, please contact your pharmacy.  They will contact our office to request authorization. Prescriptions will not be filled after 5pm or on week-ends. You should follow a light diet the first few days after arrival home, such as soup and crackers, etc.  Be sure to include lots of fluids daily. Most patients will experience some swelling and bruising in the area of the incisions.  Ice packs will help.  Swelling and bruising can take several days to resolve.  It is common to experience some constipation if taking pain medication after surgery.  Increasing fluid intake and taking a stool softener (such as Colace) will usually help or prevent this problem from occurring.  A mild laxative (Milk of Magnesia or Miralax) should be taken according to package instructions if there are no bowel movements after 48 hours. Unless discharge instructions indicate otherwise, you may remove your bandages 24-48 hours after surgery, and you may shower at that time.  You may have steri-strips (small skin tapes) in place directly over the incision.  These strips should be left on the skin for 7-10 days.  If your surgeon used skin glue on the incision, you may shower in 24 hours.  The glue will flake off over the next 2-3 weeks.  Any sutures or staples will be  removed at the office during your follow-up visit. ACTIVITIES:  You may resume regular (light) daily activities beginning the next day--such as daily self-care, walking, climbing stairs--gradually increasing activities as tolerated.  You may have sexual intercourse when it is comfortable.  Refrain from any heavy lifting or straining until approved by your doctor. You may drive when you are no longer taking prescription pain medication, you can comfortably wear a seatbelt, and you can safely maneuver your car and apply brakes. RETURN TO WORK:  __________________________________________________________ You should see your doctor in the office for a follow-up appointment approximately 2-3 weeks after your surgery.  Make sure that you call for this appointment within a day or two after you arrive home to insure a convenient appointment time. OTHER INSTRUCTIONS: __________________________________________________________________________________________________________________________ __________________________________________________________________________________________________________________________ WHEN TO CALL YOUR DOCTOR: Fever over 101.0 Inability to urinate Continued bleeding from incision. Increased pain, redness, or drainage from the incision. Increasing abdominal pain  The clinic staff is available to answer your questions during regular business hours.  Please don't hesitate to call and ask to speak to one of the nurses for clinical concerns.  If you have a medical emergency, go to the nearest emergency room or call 911.  A surgeon from [facility] is always on call at the hospital. [address], [address], [place], [place]  [address] ? [address] [address], [place], [place]   [address] [phone] ? [phone] ? FAX [phone] Web site: [email or web address]  

## 2021-12-13 NOTE — Transfer of Care (Signed)
Immediate Anesthesia Transfer of Care Note  Patient: Wanda Acosta  Procedure(s) Performed: LAPAROSCOPIC CHOLECYSTECTOMY  Patient Location: PACU  Anesthesia Type:General  Level of Consciousness: awake  Airway & Oxygen Therapy: Patient Spontanous Breathing  Post-op Assessment: Report given to RN and Post -op Vital signs reviewed and stable  Post vital signs: Reviewed and stable  Last Vitals:  Vitals Value Taken Time  BP 145/105 12/13/21 1346  Temp    Pulse 114 12/13/21 1348  Resp 15 12/13/21 1348  SpO2 100 % 12/13/21 1348  Vitals shown include unvalidated device data.  Last Pain:  Vitals:   12/13/21 1046  TempSrc:   PainSc: 0-No pain         Complications: No notable events documented.

## 2021-12-13 NOTE — Interval H&P Note (Signed)
History and Physical Interval Note:  12/13/2021 11:40 AM  Wanda Acosta  has presented today for surgery, with the diagnosis of gallstones.  The various methods of treatment have been discussed with the patient and family. After consideration of risks, benefits and other options for treatment, the patient has consented to  Procedure(s): LAPAROSCOPIC CHOLECYSTECTOMY (N/A) as a surgical intervention.  The patient's history has been reviewed, patient examined, no change in status, stable for surgery.  I have reviewed the patient's chart and labs.  Questions were answered to the patient's satisfaction.     Ralene Ok

## 2021-12-13 NOTE — Anesthesia Procedure Notes (Signed)
Procedure Name: Intubation Date/Time: 12/13/2021 12:56 PM Performed by: Imagene Riches, CRNA Pre-anesthesia Checklist: Patient identified, Emergency Drugs available, Suction available and Patient being monitored Patient Re-evaluated:Patient Re-evaluated prior to induction Oxygen Delivery Method: Circle system utilized Preoxygenation: Pre-oxygenation with 100% oxygen Induction Type: IV induction Ventilation: Mask ventilation without difficulty Laryngoscope Size: Mac and 3 Grade View: Grade I Tube type: Oral Tube size: 7.0 mm Number of attempts: 1 Airway Equipment and Method: Stylet Placement Confirmation: ETT inserted through vocal cords under direct vision, positive ETCO2 and breath sounds checked- equal and bilateral Secured at: 21 cm Tube secured with: Tape Dental Injury: Teeth and Oropharynx as per pre-operative assessment

## 2021-12-13 NOTE — Op Note (Signed)
12/13/2021  1:29 PM  PATIENT:  Wanda Acosta  31 y.o. female  PRE-OPERATIVE DIAGNOSIS:  gallstones  POST-OPERATIVE DIAGNOSIS:  chronic cholecystitis and cholelithiasis  PROCEDURE:  Procedure(s): LAPAROSCOPIC CHOLECYSTECTOMY (N/A)  SURGEON:  Surgeon(s) and Role:    * Ralene Ok, MD - Primary   ASSISTANTS: Pryor Curia, RNFA   ANESTHESIA:   local and general  EBL:  minimal   BLOOD ADMINISTERED:none  DRAINS: none   LOCAL MEDICATIONS USED:  BUPIVICAINE   SPECIMEN:  Source of Specimen:  gallbladder  DISPOSITION OF SPECIMEN:  PATHOLOGY  COUNTS:  YES  TOURNIQUET:  * No tourniquets in log *  DICTATION: .Dragon Dictation   EBL: <7NT   Complications: none   Counts: reported as correct x 2   Findings:chronic inflammation of the gallbladder and gallstones  Indications for procedure: Pt is a 52F with RUQ pain and seen to have gallstones.   Details of the procedure: The patient was taken to the operating and placed in the supine position with bilateral SCDs in place. A time out was called and all facts were verified. A pneumoperitoneum was obtained via A Veress needle technique to a pressure of 71m of mercury. A 550mtrochar was then placed in the right upper quadrant under visualization, and there were no injuries to any abdominal organs. A 11 mm port was then placed in the umbilical region after infiltrating with local anesthesia under direct visualization. A second epigastric port was placed under direct visualization.   The gallbladder was identified and retracted, the peritoneum was then sharply dissected from the gallbladder and this dissection was carried down to Calot's triangle. The cystic duct was identified and dissected circumferentially and seen going into the gallbladder 360.  The cystic artery was dissected away from the surrounding tissues.   The critical angle was obtained.    2 clips were placed proximally one distally and the cystic duct transected.  The cystic artery was identified and 2 clips placed proximally and one distally and transected. We then proceeded to remove the gallbladder off the hepatic fossa with Bovie cautery. A retrieval bag was then placed in the abdomen and gallbladder placed in the bag. The hepatic fossa was then reexamined and hemostasis was achieved with Bovie cautery and was excellent at this portion of the case. The subhepatic fossa and perihepatic fossa was then irrigated until the effluent was clear. The specimen bag and specimen were removed from the abdominal cavity.  The 11 mm trocar fascia was reapproximated with the Endo Close #1 Vicryl x2. The pneumoperitoneum was evacuated and all trochars removed under direct visulalization. The skin was then closed with 4-0 Monocryl and the skin dressed with Dermabond. The patient was awaken from general anesthesia and taken to the recovery room in stable condition.   PLAN OF CARE: Discharge to home after PACU  PATIENT DISPOSITION:  PACU - hemodynamically stable.   Delay start of Pharmacological VTE agent (>24hrs) due to surgical blood loss or risk of bleeding: not applicable

## 2021-12-13 NOTE — Anesthesia Preprocedure Evaluation (Addendum)
Anesthesia Evaluation  Patient identified by MRN, date of birth, ID band Patient awake    Reviewed: Allergy & Precautions, NPO status , Patient's Chart, lab work & pertinent test results  History of Anesthesia Complications (+) PONV and history of anesthetic complications  Airway Mallampati: I  TM Distance: >3 FB Neck ROM: Full    Dental no notable dental hx. (+) Teeth Intact, Dental Advisory Given   Pulmonary neg pulmonary ROS,    Pulmonary exam normal breath sounds clear to auscultation       Cardiovascular negative cardio ROS Normal cardiovascular exam Rhythm:Regular Rate:Normal     Neuro/Psych PSYCHIATRIC DISORDERS Anxiety Depression negative neurological ROS     GI/Hepatic Neg liver ROS, GERD  Medicated,  Endo/Other  negative endocrine ROS  Renal/GU negative Renal ROS  negative genitourinary   Musculoskeletal negative musculoskeletal ROS (+)   Abdominal   Peds  Hematology negative hematology ROS (+)   Anesthesia Other Findings   Reproductive/Obstetrics                           Anesthesia Physical Anesthesia Plan  ASA: 2  Anesthesia Plan: General   Post-op Pain Management: Tylenol PO (pre-op)*   Induction:   PONV Risk Score and Plan: 4 or greater and Ondansetron, Dexamethasone, Scopolamine patch - Pre-op and Midazolam  Airway Management Planned: Oral ETT  Additional Equipment: None  Intra-op Plan:   Post-operative Plan: Extubation in OR  Informed Consent: I have reviewed the patients History and Physical, chart, labs and discussed the procedure including the risks, benefits and alternatives for the proposed anesthesia with the patient or authorized representative who has indicated his/her understanding and acceptance.     Dental advisory given  Plan Discussed with: CRNA and Anesthesiologist  Anesthesia Plan Comments:       Anesthesia Quick Evaluation

## 2021-12-15 ENCOUNTER — Encounter (HOSPITAL_COMMUNITY): Payer: Self-pay | Admitting: General Surgery

## 2021-12-16 LAB — SURGICAL PATHOLOGY

## 2021-12-16 NOTE — Anesthesia Postprocedure Evaluation (Signed)
Anesthesia Post Note  Patient: Wallace Cogliano  Procedure(s) Performed: LAPAROSCOPIC CHOLECYSTECTOMY     Patient location during evaluation: PACU Anesthesia Type: General Level of consciousness: awake and alert Pain management: pain level controlled Vital Signs Assessment: post-procedure vital signs reviewed and stable Respiratory status: spontaneous breathing, nonlabored ventilation, respiratory function stable and patient connected to nasal cannula oxygen Cardiovascular status: blood pressure returned to baseline and stable Postop Assessment: no apparent nausea or vomiting Anesthetic complications: no   No notable events documented.  Last Vitals:  Vitals:   12/13/21 1430 12/13/21 1440  BP: 131/86 (!) 140/96  Pulse: 97 97  Resp: 11 (!) 30  Temp:  36.8 C  SpO2: 100% 100%    Last Pain:  Vitals:   12/13/21 1440  TempSrc:   PainSc: 8                  Sonnie Bias L Evangelyn Crouse

## 2022-03-04 ENCOUNTER — Other Ambulatory Visit: Payer: Self-pay | Admitting: Family Medicine

## 2022-07-08 ENCOUNTER — Other Ambulatory Visit: Payer: Self-pay | Admitting: Family Medicine

## 2022-07-20 IMAGING — US US ABDOMEN LIMITED
1 series · 14 of 25 positions shown · non-contrast
Comparison: None Available.

CLINICAL DATA: Epigastric abdominal pain

EXAM:
ULTRASOUND ABDOMEN LIMITED RIGHT UPPER QUADRANT

[Series 1: us abdomen limited ruq (liver/gb) · 14 of 46 slices shown]
[im 1/46]
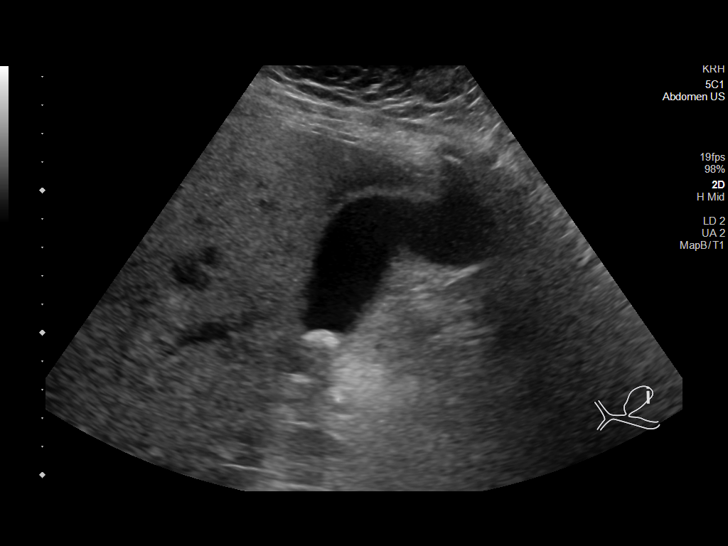
[im 4/46]
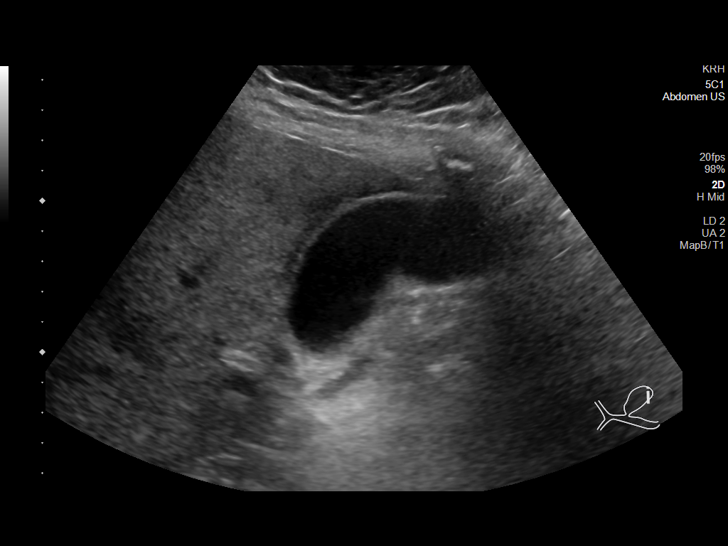
[im 8/46]
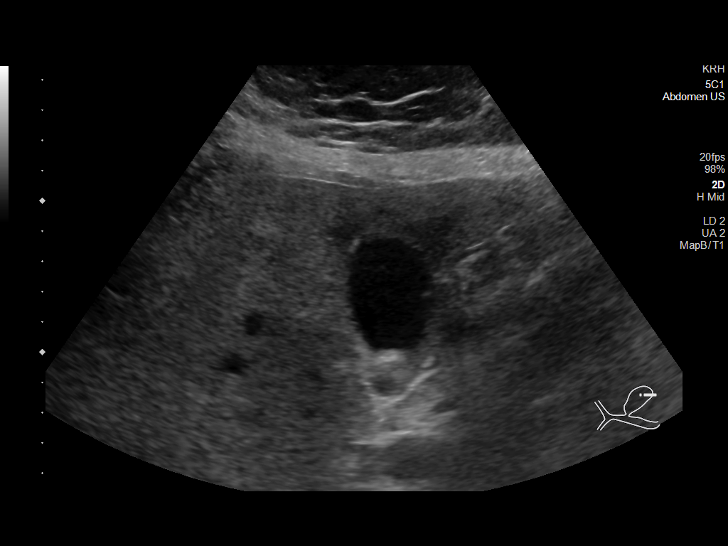
[im 12/46]
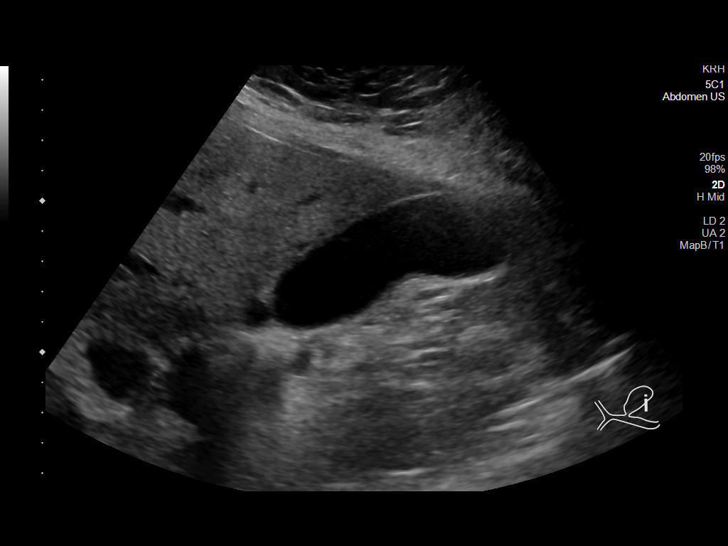
[im 16/46]
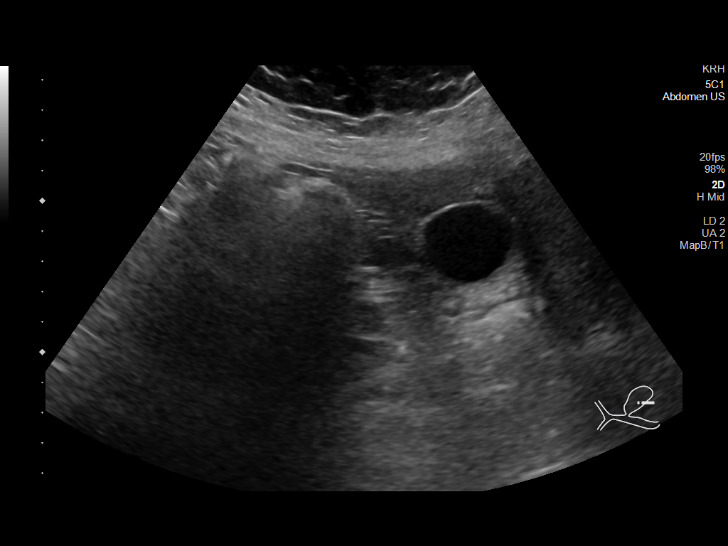
[im 17/46]
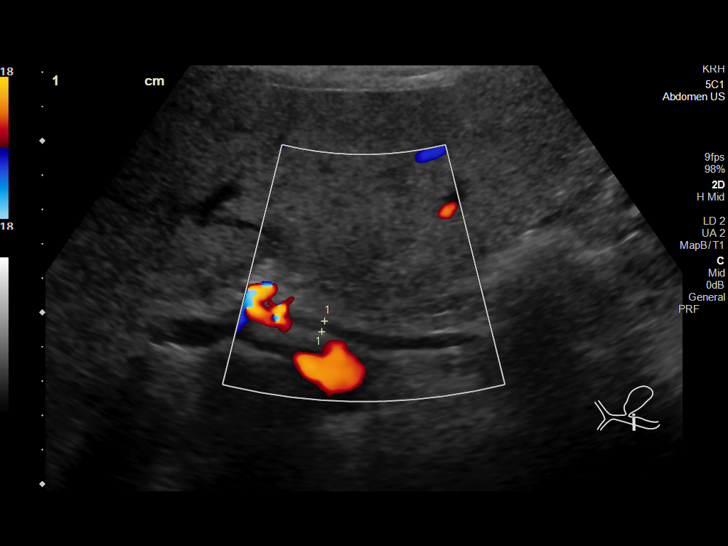
[im 21/46]
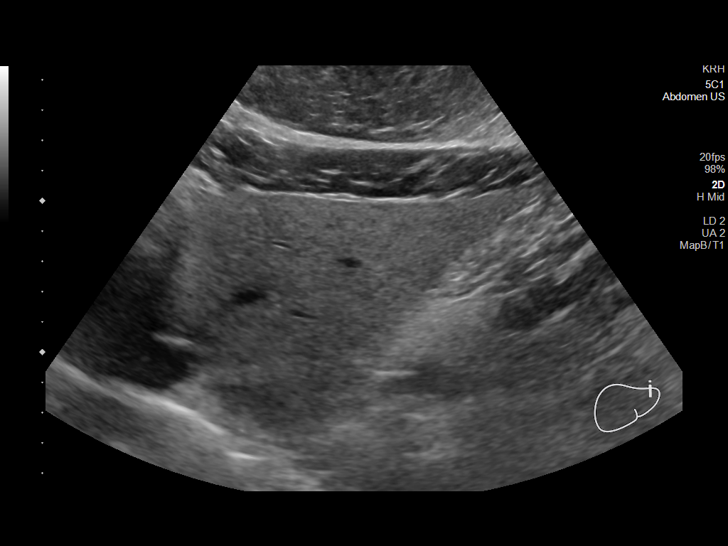
[im 25/46]
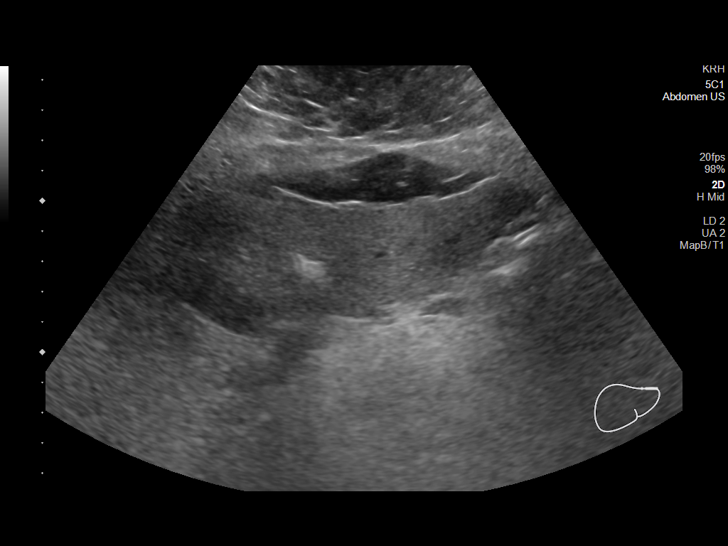
[im 29/46]
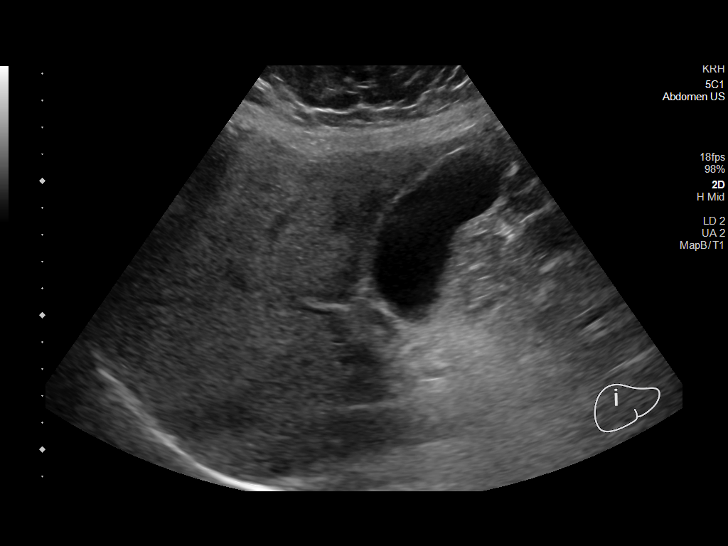
[im 31/46]
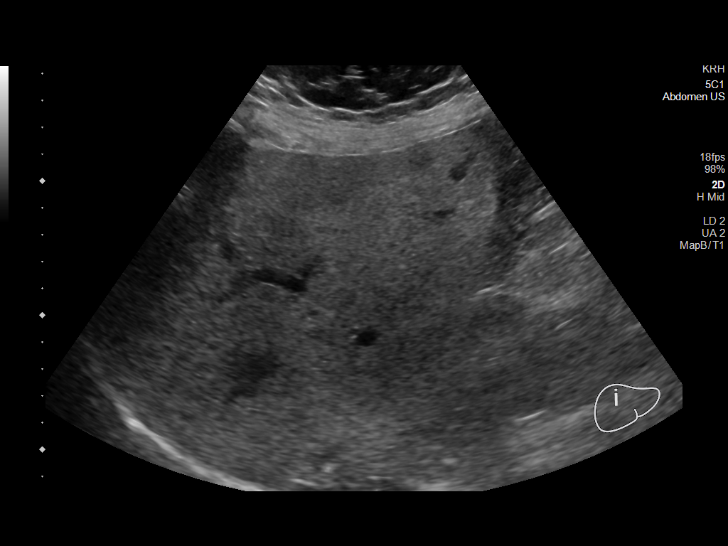
[im 34/46]
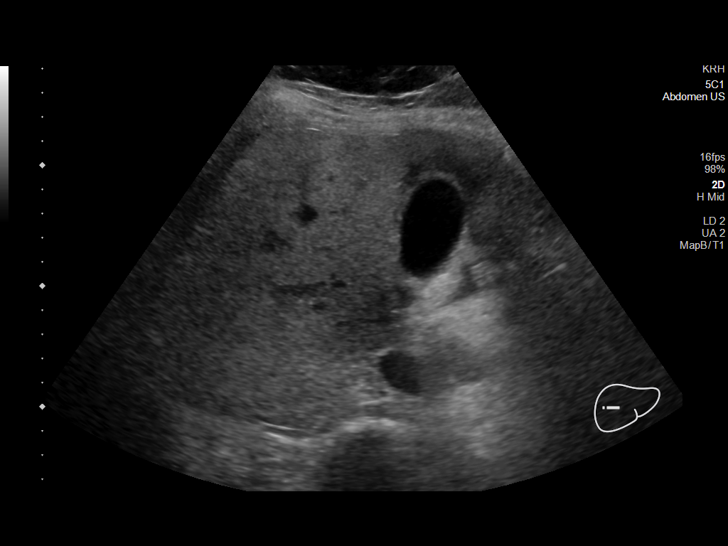
[im 38/46]
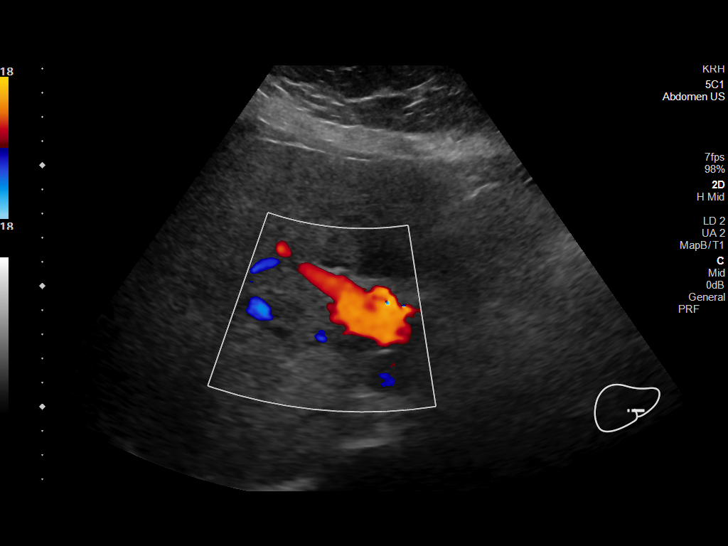
[im 42/46]
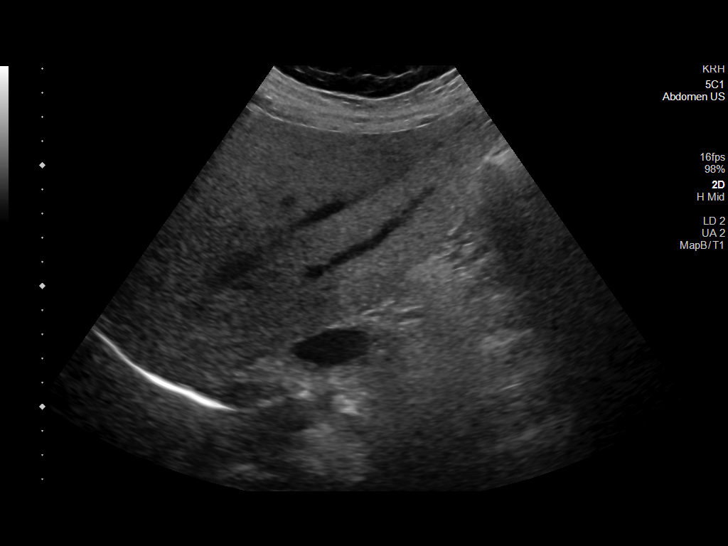
[im 46/46]
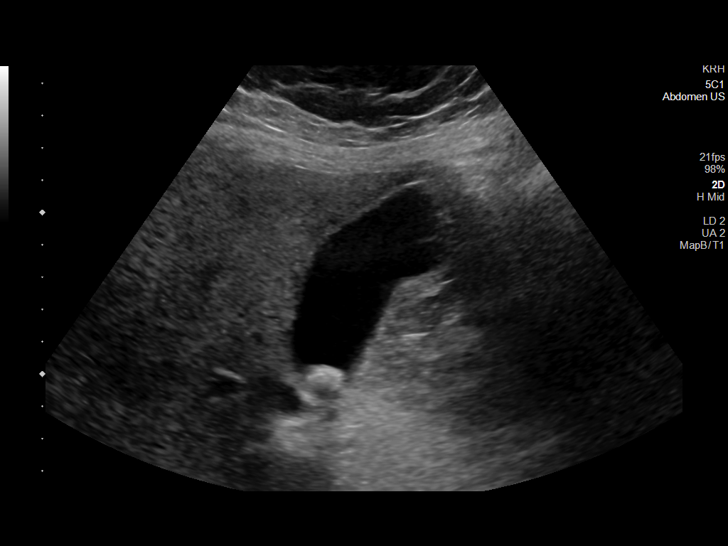

[14 of 25 positions shown; findings below may reference images not displayed]

FINDINGS: Gallbladder:

Nonmobile gallstone near the gallbladder neck measuring 1.1 cm.
Minimal pericholecystic fluid. No gallbladder wall thickening. No
sonographic Murphy sign noted by sonographer.

Common bile duct:

Diameter: 0.3 cm

Liver:

No focal lesion identified. Increased parenchymal echogenicity.
Portal vein is patent on color Doppler imaging with normal direction
of blood flow towards the liver.

Other: None.
IMPRESSION: 1. Nonmobile gallstone near the gallbladder neck measuring 1.1 cm.
Minimal pericholecystic fluid. No gallbladder wall thickening or
sonographic Murphy sign. HIDA may be used to assess for patency of
the cystic duct if there is high clinical concern for cholecystitis.
2. Hepatic steatosis.

## 2022-08-13 ENCOUNTER — Other Ambulatory Visit: Payer: Self-pay | Admitting: Family Medicine

## 2022-08-13 DIAGNOSIS — F411 Generalized anxiety disorder: Secondary | ICD-10-CM

## 2022-08-13 NOTE — Telephone Encounter (Signed)
Last RF--06/10/21 Last OV-07/01/21 Needs appt for refills Called left detailed message to schedule appointment
